# Patient Record
Sex: Female | Born: 1975 | Race: White | Hispanic: No | Marital: Single | Smoking: Former smoker
Health system: Southern US, Community
[De-identification: ages and names within clinical notes are randomized; demographics above are authoritative.]

## PROBLEM LIST (undated history)

## (undated) DIAGNOSIS — K219 Gastro-esophageal reflux disease without esophagitis: Secondary | ICD-10-CM

## (undated) DIAGNOSIS — E785 Hyperlipidemia, unspecified: Secondary | ICD-10-CM

## (undated) DIAGNOSIS — E119 Type 2 diabetes mellitus without complications: Secondary | ICD-10-CM

## (undated) HISTORY — DX: Gastro-esophageal reflux disease without esophagitis: K21.9

## (undated) HISTORY — DX: Type 2 diabetes mellitus without complications: E11.9

## (undated) HISTORY — PX: APPENDECTOMY: SHX54

---

## 2004-12-12 HISTORY — PX: ANTERIOR CRUCIATE LIGAMENT REPAIR: SHX115

## 2011-01-06 ENCOUNTER — Emergency Department (HOSPITAL_COMMUNITY): Payer: BC Managed Care – PPO

## 2011-01-06 ENCOUNTER — Emergency Department (HOSPITAL_COMMUNITY)
Admission: EM | Admit: 2011-01-06 | Discharge: 2011-01-07 | Disposition: A | Discharge status: Home / Self Care | Payer: BC Managed Care – PPO | Attending: Emergency Medicine | Admitting: Emergency Medicine

## 2011-01-06 DIAGNOSIS — R071 Chest pain on breathing: Secondary | ICD-10-CM | POA: Insufficient documentation

## 2011-01-06 DIAGNOSIS — R911 Solitary pulmonary nodule: Secondary | ICD-10-CM | POA: Insufficient documentation

## 2011-01-06 LAB — CBC
Hemoglobin: 13.3 g/dL (ref 12.0–15.0)
MCH: 28.1 pg (ref 26.0–34.0)
MCHC: 33.5 g/dL (ref 30.0–36.0)

## 2011-01-06 LAB — POCT I-STAT, CHEM 8
Calcium, Ion: 1.16 mmol/L (ref 1.12–1.32)
Creatinine, Ser: 0.9 mg/dL (ref 0.50–1.10)
Hemoglobin: 13.6 g/dL (ref 12.0–15.0)
Sodium: 141 mEq/L (ref 135–145)
TCO2: 23 mmol/L (ref 0–100)

## 2011-01-06 LAB — DIFFERENTIAL
Basophils Absolute: 0.1 10*3/uL (ref 0.0–0.1)
Basophils Relative: 1 % (ref 0–1)
Eosinophils Absolute: 0.3 10*3/uL (ref 0.0–0.7)
Monocytes Absolute: 0.6 10*3/uL (ref 0.1–1.0)
Monocytes Relative: 7 % (ref 3–12)
Neutro Abs: 4.9 10*3/uL (ref 1.7–7.7)
Neutrophils Relative %: 58 % (ref 43–77)

## 2011-01-06 MED ORDER — IOHEXOL 300 MG/ML  SOLN
100.0000 mL | Freq: Once | INTRAMUSCULAR | Status: AC | PRN
  Administered 2011-01-06: 100 mL via INTRAVENOUS

## 2012-07-16 ENCOUNTER — Other Ambulatory Visit: Payer: Self-pay | Admitting: Gastroenterology

## 2012-07-16 DIAGNOSIS — R1013 Epigastric pain: Secondary | ICD-10-CM

## 2012-07-31 ENCOUNTER — Ambulatory Visit (HOSPITAL_COMMUNITY): Payer: BC Managed Care – PPO

## 2012-07-31 ENCOUNTER — Ambulatory Visit (HOSPITAL_COMMUNITY)
Admission: RE | Admit: 2012-07-31 | Discharge: 2012-07-31 | Disposition: A | Discharge status: Home / Self Care | Payer: BC Managed Care – PPO | Source: Ambulatory Visit | Attending: Gastroenterology | Admitting: Gastroenterology

## 2012-07-31 DIAGNOSIS — K8020 Calculus of gallbladder without cholecystitis without obstruction: Secondary | ICD-10-CM | POA: Insufficient documentation

## 2012-07-31 DIAGNOSIS — K838 Other specified diseases of biliary tract: Secondary | ICD-10-CM | POA: Insufficient documentation

## 2012-07-31 DIAGNOSIS — R1013 Epigastric pain: Secondary | ICD-10-CM

## 2012-08-06 ENCOUNTER — Encounter (INDEPENDENT_AMBULATORY_CARE_PROVIDER_SITE_OTHER): Payer: Self-pay | Admitting: General Surgery

## 2012-08-06 ENCOUNTER — Ambulatory Visit (INDEPENDENT_AMBULATORY_CARE_PROVIDER_SITE_OTHER): Payer: BC Managed Care – PPO | Admitting: General Surgery

## 2012-08-06 VITALS — BP 126/81 | HR 64 | Temp 98.0°F | Resp 14 | Ht 64.0 in | Wt 235.6 lb

## 2012-08-06 DIAGNOSIS — K8020 Calculus of gallbladder without cholecystitis without obstruction: Secondary | ICD-10-CM

## 2012-08-06 NOTE — Progress Notes (Signed)
Patient ID: Latoya Hayden, female   DOB: 10/12/1975, 37 y.o.   MRN: [id number]  Chief Complaint  Patient presents with  . Abdominal Pain    gallbladder    HPI Latoya Hayden is a 37 y.o. female.  This patient was referred by Dr. Mann for evaluation of symptomatic cholelithiasis. She was initially seen by her gastroenterologist for evaluation of upper abdominal and epigastric abdominal pain which she describes as a band of "tightening" across her upper abdomen which occurs after eating green vegetables and  Dairy products. She notices association with greasy foods. She says that these episodes lasted about 30 minutes to a couple hours and her most recent occurrence was last night. She does have some associated mild nausea without vomiting but denies any fevers or chills and denies any blood in the stool or melena. She has lost about 15 pounds since this occurred due to fear of eating. She did have an ultrasound which demonstrated a 2.6 cm gallstone in the fundus of the gallbladder without other abnormalities. HPI  Past Medical History  Diagnosis Date  . Diabetes mellitus without complication   . GERD (gastroesophageal reflux disease)     Past Surgical History  Procedure Laterality Date  . Appendectomy    . Anterior cruciate ligament repair Right 12/12/2004    History reviewed. No pertinent family history.  Social History History  Substance Use Topics  . Smoking status: Former Smoker    Quit date: 08/07/2003  . Smokeless tobacco: Not on file  . Alcohol Use: Yes    No Known Allergies  Current Outpatient Prescriptions  Medication Sig Dispense Refill  . fenofibrate 54 MG tablet       . HYDROcodone-homatropine (HYCODAN) 5-1.5 MG/5ML syrup       . metFORMIN (GLUCOPHAGE) 500 MG tablet       . TRI-PREVIFEM 0.18/0.215/0.25 MG-35 MCG tablet        No current facility-administered medications for this visit.    Review of Systems Review of Systems All other review of  systems negative or noncontributory except as stated in the HPI  Blood pressure 126/81, pulse 64, temperature 98 F (36.7 C), temperature source Temporal, resp. rate 14, height 5' 4" (1.626 m), weight 235 lb 9.6 oz (106.867 kg).  Physical Exam Physical Exam Physical Exam  Nursing note and vitals reviewed. Constitutional: She is oriented to person, place, and time. She appears well-developed and well-nourished. No distress.  HENT:  Head: Normocephalic and atraumatic.  Mouth/Throat: No oropharyngeal exudate.  Eyes: Conjunctivae and EOM are normal. Pupils are equal, round, and reactive to light. Right eye exhibits no discharge. Left eye exhibits no discharge. No scleral icterus.  Neck: Normal range of motion. Neck supple. No tracheal deviation present.  Cardiovascular: Normal rate, regular rhythm, normal heart sounds and intact distal pulses.   Pulmonary/Chest: Effort normal and breath sounds normal. No stridor. No respiratory distress. She has no wheezes.  Abdominal: Soft. Bowel sounds are normal. She exhibits no distension and no mass. There is no tenderness. There is no rebound and no guarding.  Musculoskeletal: Normal range of motion. She exhibits no edema and no tenderness.  Neurological: She is alert and oriented to person, place, and time.  Skin: Skin is warm and dry. No rash noted. She is not diaphoretic. No erythema. No pallor.  Psychiatric: She has a normal mood and affect. Her behavior is normal. Judgment and thought content normal.    Data Reviewed [place]  Assessment    Symptomatic cholelithiasis   I agree with this is most likely symptomatic cholelithiasis. I discussed with her the options for washer waiting versus cholecystectomy and she would like to proceed with cholecystectomy. We discussed the risks and benefits of the procedure.  The risks of infection, bleeding, pain, persistent symptoms, scarring, injury to bowel or bile ducts, retained stone, diarrhea, need for additional  procedures, and need for open surgery discussed with the patient. She would like to proceed with cholecystectomy      Plan    We will go ahead and set her up for cholecystectomy as soon as convenient        Swayzie Choate DAVID 08/06/2012, 10:05 AM    

## 2012-08-10 ENCOUNTER — Encounter (HOSPITAL_COMMUNITY): Payer: Self-pay | Admitting: Pharmacy Technician

## 2012-08-10 ENCOUNTER — Encounter (HOSPITAL_COMMUNITY): Payer: Self-pay | Admitting: General Surgery

## 2012-08-10 MED ORDER — DEXTROSE 5 % IV SOLN
2.0000 g | INTRAVENOUS | Status: AC
  Administered 2012-08-11: 2 g via INTRAVENOUS

## 2012-08-11 ENCOUNTER — Ambulatory Visit (HOSPITAL_COMMUNITY): Payer: BC Managed Care – PPO

## 2012-08-11 ENCOUNTER — Encounter (HOSPITAL_COMMUNITY): Payer: Self-pay | Admitting: *Deleted

## 2012-08-11 ENCOUNTER — Ambulatory Visit (HOSPITAL_COMMUNITY): Payer: BC Managed Care – PPO | Admitting: Certified Registered"

## 2012-08-11 ENCOUNTER — Encounter (HOSPITAL_COMMUNITY): Payer: Self-pay | Admitting: Certified Registered"

## 2012-08-11 ENCOUNTER — Ambulatory Visit (HOSPITAL_COMMUNITY)
Admission: RE | Admit: 2012-08-11 | Discharge: 2012-08-11 | Disposition: A | Discharge status: Home / Self Care | Payer: BC Managed Care – PPO | Source: Ambulatory Visit | Attending: General Surgery | Admitting: General Surgery

## 2012-08-11 ENCOUNTER — Encounter (HOSPITAL_COMMUNITY)
Admission: RE | Disposition: A | Discharge status: Home / Self Care | Payer: Self-pay | Source: Ambulatory Visit | Attending: General Surgery

## 2012-08-11 DIAGNOSIS — K8010 Calculus of gallbladder with chronic cholecystitis without obstruction: Secondary | ICD-10-CM | POA: Insufficient documentation

## 2012-08-11 DIAGNOSIS — K824 Cholesterolosis of gallbladder: Secondary | ICD-10-CM

## 2012-08-11 DIAGNOSIS — K219 Gastro-esophageal reflux disease without esophagitis: Secondary | ICD-10-CM | POA: Insufficient documentation

## 2012-08-11 DIAGNOSIS — Z87891 Personal history of nicotine dependence: Secondary | ICD-10-CM | POA: Insufficient documentation

## 2012-08-11 DIAGNOSIS — Z79899 Other long term (current) drug therapy: Secondary | ICD-10-CM | POA: Insufficient documentation

## 2012-08-11 DIAGNOSIS — E119 Type 2 diabetes mellitus without complications: Secondary | ICD-10-CM | POA: Insufficient documentation

## 2012-08-11 DIAGNOSIS — K8020 Calculus of gallbladder without cholecystitis without obstruction: Secondary | ICD-10-CM

## 2012-08-11 HISTORY — PX: CHOLECYSTECTOMY: SHX55

## 2012-08-11 HISTORY — DX: Hyperlipidemia, unspecified: E78.5

## 2012-08-11 LAB — COMPREHENSIVE METABOLIC PANEL
ALT: 20 U/L (ref 0–35)
Albumin: 3.6 g/dL (ref 3.5–5.2)
Alkaline Phosphatase: 42 U/L (ref 39–117)
Potassium: 4.3 mEq/L (ref 3.5–5.1)
Sodium: 139 mEq/L (ref 135–145)
Total Protein: 7.1 g/dL (ref 6.0–8.3)

## 2012-08-11 LAB — CBC WITH DIFFERENTIAL/PLATELET
Basophils Relative: 1 % (ref 0–1)
Eosinophils Absolute: 0.7 10*3/uL (ref 0.0–0.7)
Lymphs Abs: 2 10*3/uL (ref 0.7–4.0)
MCH: 27.5 pg (ref 26.0–34.0)
MCHC: 34.6 g/dL (ref 30.0–36.0)
Neutrophils Relative %: 56 % (ref 43–77)
Platelets: 285 10*3/uL (ref 150–400)
RBC: 4.76 MIL/uL (ref 3.87–5.11)

## 2012-08-11 LAB — GLUCOSE, CAPILLARY: Glucose-Capillary: 104 mg/dL — ABNORMAL HIGH (ref 70–99)

## 2012-08-11 LAB — SURGICAL PCR SCREEN: Staphylococcus aureus: NEGATIVE

## 2012-08-11 SURGERY — LAPAROSCOPIC CHOLECYSTECTOMY WITH INTRAOPERATIVE CHOLANGIOGRAM
Anesthesia: General | Site: Abdomen | Wound class: Clean Contaminated

## 2012-08-11 MED ORDER — ACETAMINOPHEN 10 MG/ML IV SOLN
INTRAVENOUS | Status: AC
  Administered 2012-08-11: 1000 mg via INTRAVENOUS
  Filled 2012-08-11: qty 100

## 2012-08-11 MED ORDER — HYDROMORPHONE HCL PF 1 MG/ML IJ SOLN
INTRAMUSCULAR | Status: AC
  Filled 2012-08-11: qty 1

## 2012-08-11 MED ORDER — NEOSTIGMINE METHYLSULFATE 1 MG/ML IJ SOLN
INTRAMUSCULAR | Status: DC | PRN
  Administered 2012-08-11: 3 mg via INTRAVENOUS

## 2012-08-11 MED ORDER — FENTANYL CITRATE 0.05 MG/ML IJ SOLN
INTRAMUSCULAR | Status: DC | PRN
  Administered 2012-08-11 (×4): 50 ug via INTRAVENOUS
  Administered 2012-08-11: 200 ug via INTRAVENOUS
  Administered 2012-08-11 (×2): 50 ug via INTRAVENOUS

## 2012-08-11 MED ORDER — ROCURONIUM BROMIDE 100 MG/10ML IV SOLN
INTRAVENOUS | Status: DC | PRN
  Administered 2012-08-11: 50 mg via INTRAVENOUS

## 2012-08-11 MED ORDER — MUPIROCIN 2 % EX OINT: TOPICAL_OINTMENT | Freq: Two times a day (BID) | CUTANEOUS | Status: DC

## 2012-08-11 MED ORDER — LACTATED RINGERS IV SOLN
INTRAVENOUS | Status: DC | PRN
  Administered 2012-08-11 (×2): via INTRAVENOUS

## 2012-08-11 MED ORDER — PROMETHAZINE HCL 25 MG/ML IJ SOLN
INTRAMUSCULAR | Status: AC
  Filled 2012-08-11: qty 1

## 2012-08-11 MED ORDER — LIDOCAINE-EPINEPHRINE (PF) 1 %-1:200000 IJ SOLN
INTRAMUSCULAR | Status: DC | PRN
  Administered 2012-08-11: 09:00:00 via INTRAMUSCULAR

## 2012-08-11 MED ORDER — OXYCODONE HCL 5 MG/5ML PO SOLN: 5.0000 mg | Freq: Once | ORAL | Status: DC | PRN

## 2012-08-11 MED ORDER — LIDOCAINE HCL (CARDIAC) 20 MG/ML IV SOLN
INTRAVENOUS | Status: DC | PRN
  Administered 2012-08-11: 30 mg via INTRAVENOUS

## 2012-08-11 MED ORDER — 0.9 % SODIUM CHLORIDE (POUR BTL) OPTIME
TOPICAL | Status: DC | PRN
  Administered 2012-08-11: 1000 mL

## 2012-08-11 MED ORDER — OXYCODONE HCL 5 MG PO TABS: 5.0000 mg | ORAL_TABLET | ORAL | Status: DC | PRN

## 2012-08-11 MED ORDER — GLYCOPYRROLATE 0.2 MG/ML IJ SOLN
INTRAMUSCULAR | Status: DC | PRN
  Administered 2012-08-11: 0.2 mg via INTRAVENOUS
  Administered 2012-08-11: 0.4 mg via INTRAVENOUS

## 2012-08-11 MED ORDER — PROMETHAZINE HCL 25 MG/ML IJ SOLN
6.2500 mg | INTRAMUSCULAR | Status: DC | PRN
  Administered 2012-08-11: 6.25 mg via INTRAVENOUS

## 2012-08-11 MED ORDER — MIDAZOLAM HCL 5 MG/5ML IJ SOLN
INTRAMUSCULAR | Status: DC | PRN
  Administered 2012-08-11: 2 mg via INTRAVENOUS

## 2012-08-11 MED ORDER — CEFAZOLIN SODIUM-DEXTROSE 2-3 GM-% IV SOLR
INTRAVENOUS | Status: AC
  Filled 2012-08-11: qty 50

## 2012-08-11 MED ORDER — MUPIROCIN 2 % EX OINT
TOPICAL_OINTMENT | CUTANEOUS | Status: AC
  Administered 2012-08-11: 1 via NASAL
  Filled 2012-08-11: qty 22

## 2012-08-11 MED ORDER — ONDANSETRON HCL 4 MG/2ML IJ SOLN
INTRAMUSCULAR | Status: AC
  Filled 2012-08-11: qty 2

## 2012-08-11 MED ORDER — ONDANSETRON HCL 4 MG/2ML IJ SOLN
INTRAMUSCULAR | Status: DC | PRN
  Administered 2012-08-11: 4 mg via INTRAVENOUS

## 2012-08-11 MED ORDER — HYDROMORPHONE HCL PF 1 MG/ML IJ SOLN
0.2500 mg | INTRAMUSCULAR | Status: DC | PRN
  Administered 2012-08-11 (×2): 0.25 mg via INTRAVENOUS

## 2012-08-11 MED ORDER — MIDAZOLAM HCL 2 MG/2ML IJ SOLN: 0.5000 mg | Freq: Once | INTRAMUSCULAR | Status: DC | PRN

## 2012-08-11 MED ORDER — SODIUM CHLORIDE 0.9 % IR SOLN
Status: DC | PRN
  Administered 2012-08-11: 1000 mL

## 2012-08-11 MED ORDER — MEPERIDINE HCL 25 MG/ML IJ SOLN: 6.2500 mg | INTRAMUSCULAR | Status: DC | PRN

## 2012-08-11 MED ORDER — OXYCODONE HCL 5 MG PO TABS: 5.0000 mg | ORAL_TABLET | Freq: Once | ORAL | Status: DC | PRN

## 2012-08-11 MED ORDER — HYDROCODONE-ACETAMINOPHEN 5-325 MG PO TABS: 1.0000 | ORAL_TABLET | ORAL | Status: DC | PRN

## 2012-08-11 MED ORDER — PROPOFOL 10 MG/ML IV BOLUS
INTRAVENOUS | Status: DC | PRN
  Administered 2012-08-11: 200 mg via INTRAVENOUS

## 2012-08-11 MED ORDER — SODIUM CHLORIDE 0.9 % IV SOLN
INTRAVENOUS | Status: DC | PRN
  Administered 2012-08-11: 09:00:00

## 2012-08-11 SURGICAL SUPPLY — 44 items
APPLIER CLIP ROT 10 11.4 M/L (STAPLE) ×2
BLADE SURG ROTATE 9660 (MISCELLANEOUS) IMPLANT
CANISTER SUCTION 2500CC (MISCELLANEOUS) ×2 IMPLANT
CHLORAPREP W/TINT 26ML (MISCELLANEOUS) ×2 IMPLANT
CLIP APPLIE ROT 10 11.4 M/L (STAPLE) ×1 IMPLANT
CLOTH BEACON ORANGE TIMEOUT ST (SAFETY) ×2 IMPLANT
COVER SURGICAL LIGHT HANDLE (MISCELLANEOUS) ×2 IMPLANT
DECANTER SPIKE VIAL GLASS SM (MISCELLANEOUS) IMPLANT
DERMABOND ADVANCED (GAUZE/BANDAGES/DRESSINGS) ×1
DERMABOND ADVANCED .7 DNX12 (GAUZE/BANDAGES/DRESSINGS) ×1 IMPLANT
DRAPE C-ARM 42X72 X-RAY (DRAPES) ×2 IMPLANT
ELECT CAUTERY BLADE 6.4 (BLADE) ×2 IMPLANT
ELECT REM PT RETURN 9FT ADLT (ELECTROSURGICAL) ×2
ELECTRODE REM PT RTRN 9FT ADLT (ELECTROSURGICAL) ×1 IMPLANT
GLOVE BIO SURGEON STRL SZ7 (GLOVE) ×2 IMPLANT
GLOVE BIO SURGEON STRL SZ7.5 (GLOVE) ×2 IMPLANT
GLOVE BIOGEL PI IND STRL 7.0 (GLOVE) ×3 IMPLANT
GLOVE BIOGEL PI IND STRL 7.5 (GLOVE) ×1 IMPLANT
GLOVE BIOGEL PI INDICATOR 7.0 (GLOVE) ×3
GLOVE BIOGEL PI INDICATOR 7.5 (GLOVE) ×1
GLOVE SURG SS PI 7.0 STRL IVOR (GLOVE) ×2 IMPLANT
GLOVE SURG SS PI 7.5 STRL IVOR (GLOVE) ×4 IMPLANT
GOWN STRL NON-REIN LRG LVL3 (GOWN DISPOSABLE) ×6 IMPLANT
GOWN STRL REIN XL XLG (GOWN DISPOSABLE) ×2 IMPLANT
KIT BASIN OR (CUSTOM PROCEDURE TRAY) ×2 IMPLANT
KIT ROOM TURNOVER OR (KITS) ×2 IMPLANT
NS IRRIG 1000ML POUR BTL (IV SOLUTION) ×2 IMPLANT
PAD ARMBOARD 7.5X6 YLW CONV (MISCELLANEOUS) ×2 IMPLANT
PENCIL BUTTON HOLSTER BLD 10FT (ELECTRODE) ×2 IMPLANT
POUCH SPECIMEN RETRIEVAL 10MM (ENDOMECHANICALS) ×2 IMPLANT
SCISSORS LAP 5X35 DISP (ENDOMECHANICALS) IMPLANT
SET CHOLANGIOGRAPH 5 50 .035 (SET/KITS/TRAYS/PACK) ×2 IMPLANT
SET IRRIG TUBING LAPAROSCOPIC (IRRIGATION / IRRIGATOR) ×2 IMPLANT
SLEEVE ENDOPATH XCEL 5M (ENDOMECHANICALS) ×2 IMPLANT
SPECIMEN JAR SMALL (MISCELLANEOUS) ×2 IMPLANT
SUT MNCRL AB 4-0 PS2 18 (SUTURE) ×4 IMPLANT
SUT VICRYL 0 UR6 27IN ABS (SUTURE) ×2 IMPLANT
TOWEL OR 17X24 6PK STRL BLUE (TOWEL DISPOSABLE) ×2 IMPLANT
TOWEL OR 17X26 10 PK STRL BLUE (TOWEL DISPOSABLE) ×2 IMPLANT
TRAY FOLEY CATH 14FR (SET/KITS/TRAYS/PACK) IMPLANT
TRAY LAPAROSCOPIC (CUSTOM PROCEDURE TRAY) ×2 IMPLANT
TROCAR BALLN 12MMX100 BLUNT (TROCAR) ×2 IMPLANT
TROCAR XCEL NON-BLD 11X100MML (ENDOMECHANICALS) ×2 IMPLANT
TROCAR XCEL NON-BLD 5MMX100MML (ENDOMECHANICALS) ×2 IMPLANT

## 2012-08-11 NOTE — Anesthesia Preprocedure Evaluation (Signed)
Anesthesia Evaluation  Patient identified by MRN, date of birth, ID band Patient awake    Reviewed: Allergy & Precautions, H&P , NPO status , Patient's Chart, lab work & pertinent test results  History of Anesthesia Complications Negative for: history of anesthetic complications  Airway Mallampati: II TM Distance: >3 FB Neck ROM: Full    Dental  (+) Teeth Intact and Dental Advisory Given   Pulmonary neg pulmonary ROS,  breath sounds clear to auscultation  Pulmonary exam normal       Cardiovascular negative cardio ROS  Rhythm:Regular     Neuro/Psych negative neurological ROS  negative psych ROS   GI/Hepatic Neg liver ROS, GERD-  Medicated and Controlled,  Endo/Other  diabetes (glu 104), Well Controlled, Type 2, Oral Hypoglycemic AgentsMorbid obesity  Renal/GU negative Renal ROS     Musculoskeletal   Abdominal (+) + obese,   Peds  Hematology   Anesthesia Other Findings   Reproductive/Obstetrics LMP this week                           Anesthesia Physical Anesthesia Plan  ASA: II  Anesthesia Plan: General   Post-op Pain Management:    Induction: Intravenous  Airway Management Planned: Oral ETT  Additional Equipment:   Intra-op Plan:   Post-operative Plan: Extubation in OR  Informed Consent: I have reviewed the patients History and Physical, chart, labs and discussed the procedure including the risks, benefits and alternatives for the proposed anesthesia with the patient or authorized representative who has indicated his/her understanding and acceptance.   Dental advisory given  Plan Discussed with: Surgeon and CRNA  Anesthesia Plan Comments: (Plan routine monitors, GETA)        Anesthesia Quick Evaluation

## 2012-08-11 NOTE — H&P (View-Only) (Signed)
Patient ID: Latoya Hayden, female   DOB: 1976-01-28, 37 y.o.   MRN: [id number]  Chief Complaint  Patient presents with  . Abdominal Pain    gallbladder    HPI Latoya Hayden is a 37 y.o. female.  This patient was referred by Dr. Loreta Ave for evaluation of symptomatic cholelithiasis. She was initially seen by her gastroenterologist for evaluation of upper abdominal and epigastric abdominal pain which she describes as a band of "tightening" across her upper abdomen which occurs after eating green vegetables and  Dairy products. She notices association with greasy foods. She says that these episodes lasted about 30 minutes to a couple hours and her most recent occurrence was last night. She does have some associated mild nausea without vomiting but denies any fevers or chills and denies any blood in the stool or melena. She has lost about 15 pounds since this occurred due to fear of eating. She did have an ultrasound which demonstrated a 2.6 cm gallstone in the fundus of the gallbladder without other abnormalities. HPI  Past Medical History  Diagnosis Date  . Diabetes mellitus without complication   . GERD (gastroesophageal reflux disease)     Past Surgical History  Procedure Laterality Date  . Appendectomy    . Anterior cruciate ligament repair Right 12/12/2004    History reviewed. No pertinent family history.  Social History History  Substance Use Topics  . Smoking status: Former Smoker    Quit date: 08/07/2003  . Smokeless tobacco: Not on file  . Alcohol Use: Yes    No Known Allergies  Current Outpatient Prescriptions  Medication Sig Dispense Refill  . fenofibrate 54 MG tablet       . HYDROcodone-homatropine (HYCODAN) 5-1.5 MG/5ML syrup       . metFORMIN (GLUCOPHAGE) 500 MG tablet       . TRI-PREVIFEM 0.18/0.215/0.25 MG-35 MCG tablet        No current facility-administered medications for this visit.    Review of Systems Review of Systems All other review of  systems negative or noncontributory except as stated in the HPI  Blood pressure 126/81, pulse 64, temperature 98 F (36.7 C), temperature source Temporal, resp. rate 14, height 5\' 4"  (1.626 m), weight 235 lb 9.6 oz (106.867 kg).  Physical Exam Physical Exam Physical Exam  Nursing note and vitals reviewed. Constitutional: She is oriented to person, place, and time. She appears well-developed and well-nourished. No distress.  HENT:  Head: Normocephalic and atraumatic.  Mouth/Throat: No oropharyngeal exudate.  Eyes: Conjunctivae and EOM are normal. Pupils are equal, round, and reactive to light. Right eye exhibits no discharge. Left eye exhibits no discharge. No scleral icterus.  Neck: Normal range of motion. Neck supple. No tracheal deviation present.  Cardiovascular: Normal rate, regular rhythm, normal heart sounds and intact distal pulses.   Pulmonary/Chest: Effort normal and breath sounds normal. No stridor. No respiratory distress. She has no wheezes.  Abdominal: Soft. Bowel sounds are normal. She exhibits no distension and no mass. There is no tenderness. There is no rebound and no guarding.  Musculoskeletal: Normal range of motion. She exhibits no edema and no tenderness.  Neurological: She is alert and oriented to person, place, and time.  Skin: Skin is warm and dry. No rash noted. She is not diaphoretic. No erythema. No pallor.  Psychiatric: She has a normal mood and affect. Her behavior is normal. Judgment and thought content normal.    Data Reviewed [place]  Assessment    Symptomatic cholelithiasis  I agree with this is most likely symptomatic cholelithiasis. I discussed with her the options for washer waiting versus cholecystectomy and she would like to proceed with cholecystectomy. We discussed the risks and benefits of the procedure.  The risks of infection, bleeding, pain, persistent symptoms, scarring, injury to bowel or bile ducts, retained stone, diarrhea, need for additional  procedures, and need for open surgery discussed with the patient. She would like to proceed with cholecystectomy      Plan    We will go ahead and set her up for cholecystectomy as soon as convenient        Latoya Hayden Latoya 08/06/2012, 10:05 AM

## 2012-08-11 NOTE — Transfer of Care (Signed)
Immediate Anesthesia Transfer of Care Note  Patient: Latoya Hayden  Procedure(s) Performed: Procedure(s): LAPAROSCOPIC CHOLECYSTECTOMY WITH INTRAOPERATIVE CHOLANGIOGRAM (N/A)  Patient Location: PACU  Anesthesia Type:General  Level of Consciousness: awake, alert , oriented and patient cooperative  Airway & Oxygen Therapy: Patient Spontanous Breathing and Patient connected to nasal cannula oxygen  Post-op Assessment: Report given to PACU RN, Post -op Vital signs reviewed and stable and Patient moving all extremities  Post vital signs: Reviewed and stable  Complications: No apparent anesthesia complications

## 2012-08-11 NOTE — Brief Op Note (Signed)
08/11/2012  9:09 AM  PATIENT:  Latoya Hayden  37 y.o. female  PRE-OPERATIVE DIAGNOSIS:  cholelithiasis   POST-OPERATIVE DIAGNOSIS:  cholelithiasis   PROCEDURE:  Procedure(s): LAPAROSCOPIC CHOLECYSTECTOMY WITH INTRAOPERATIVE CHOLANGIOGRAM (N/A)  SURGEON:  Surgeon(s) and Role:    * Lodema Pilot, DO - Primary  PHYSICIAN ASSISTANT:   ASSISTANTS: none   ANESTHESIA:   general  EBL:  Total I/O In: 1300 [I.V.:1300] Out: 50 [Blood:50]  BLOOD ADMINISTERED:none  DRAINS: none   LOCAL MEDICATIONS USED:  MARCAINE    and LIDOCAINE   SPECIMEN:  Source of Specimen:  gallbladder  DISPOSITION OF SPECIMEN:  PATHOLOGY  COUNTS:  YES  TOURNIQUET:  * No tourniquets in log *  DICTATION: .Other Dictation: Dictation Number [id number]  PLAN OF CARE: Discharge to home after PACU  PATIENT DISPOSITION:  PACU - hemodynamically stable.   Delay start of Pharmacological VTE agent (>24hrs) due to surgical blood loss or risk of bleeding: no

## 2012-08-11 NOTE — Anesthesia Procedure Notes (Signed)
Procedure Name: Intubation Date/Time: 08/11/2012 7:41 AM Performed by: Jerilee Hoh Pre-anesthesia Checklist: Patient identified, Emergency Drugs available, Suction available and Patient being monitored Patient Re-evaluated:Patient Re-evaluated prior to inductionOxygen Delivery Method: Circle system utilized Preoxygenation: Pre-oxygenation with 100% oxygen Intubation Type: IV induction Ventilation: Mask ventilation without difficulty Laryngoscope Size: Mac and 3 Grade View: Grade I Tube type: Oral Tube size: 7.0 mm Number of attempts: 1 Airway Equipment and Method: Stylet Placement Confirmation: ETT inserted through vocal cords under direct vision,  positive ETCO2 and breath sounds checked- equal and bilateral Secured at: 21 cm Tube secured with: Tape Dental Injury: Teeth and Oropharynx as per pre-operative assessment  Comments: Smooth IV induction. Easy mask airway by [facility] paramedic.  DL x1 by [facility] paramedic, atraumatic oral intubation.

## 2012-08-11 NOTE — Op Note (Signed)
NAMESMRITHI, Hayden           ACCOUNT NO.:  [id number]  MEDICAL RECORD NO.:  [id number]  LOCATION:  [facility]                         FACILITY:  [facility]  PHYSICIAN:  Lodema Pilot, MD       DATE OF BIRTH:  March 26, 1976  DATE OF PROCEDURE:  08/11/2012 DATE OF DISCHARGE:  08/11/2012                              OPERATIVE REPORT   PROCEDURE:  Laparoscopic cholecystectomy with intraoperative cholangiogram.  PREOPERATIVE DIAGNOSIS:  Symptomatic cholelithiasis.  POSTOPERATIVE DIAGNOSIS:  Symptomatic cholelithiasis.  SURGEON:  Lodema Pilot, MD  ASSISTANT:  None.  ANESTHESIA:  General endotracheal tube anesthesia with 28 mL of 1% lidocaine with epinephrine and 0.25% Marcaine in a 50:50 mixture.  FLUIDS:  1300 mL of crystalloid.  ESTIMATED BLOOD LOSS:  Minimal.  DRAINS:  None.  SPECIMEN:  Gallbladder and contents sent to Pathology for permanent sectioning.  COMPLICATIONS:  None apparent.  FINDINGS:  Normal-appearing gallbladder anatomy.  Initial cholangiogram look like there was possibly some small gallstones or air bubbles in the common bile duct.  After flushing the duct and repeating the cholangiogram, the cholangiogram appeared normal.  INDICATION FOR PROCEDURE:  Latoya Hayden is a 37 year old female with few episodes of symptomatic abdominal pain consistent with symptomatic cholelithiasis.  She had an ultrasound demonstrating large gallstone and she desired cholecystectomy for definitive treatment.  OPERATIVE DETAILS:  [name] was seen and evaluated in the preoperative area and risks and benefits of the procedure were again discussed in lay terms.  Informed consent was obtained.  She was taken to the operating room, placed on the table in supine position and general endotracheal tube anesthesia was obtained.  Prophylactic antibiotics were given.  Her abdomen was prepped and draped in a standard surgical fashion.  Procedure time-out was performed with all operative  team members and a supraumbilical midline incision was made in the skin and the area of a scar, which was already present, and dissection carried down to the subcutaneous tissue using blunt dissection.  The abdominal wall fascia was elevated and sharply incised and peritoneum entered bluntly with my finger.  A 12-mm balloon port was placed at the umbilicus and pneumoperitoneum was obtained.  Laparoscope was introduced and there was no evidence of bleeding or bowel injury upon entry.  Two 5-mm right upper quadrant ports were placed under direct visualization and 11-mm epigastric port was placed.  The gallbladder was identified and did not appear to have any inflammatory changes.  No other obvious intra-abdominal pathology was identified. The gallbladder was retracted cephalad and the peritoneum was taken down from the gallbladder using blunt dissection.  The cystic duct and cystic artery were easily identified in the usual anatomic position.  She had a small artery just medial to the cystic duct and this was skeletonized and clips were placed, but it was not divided at this time.  The cystic duct was skeletonized and critical view of safety was obtained visualizing a single cystic duct entering the gallbladder and liver parenchyma had visualized through the triangle of Calot and the single cystic artery coursing through the triangle of Calot.  A clip was placed on the gallbladder and cystic duct junction and small ductotomy was made and the cholangiogram was performed.  Initially, the cholangiogram appeared to have some filling defects in the hepatic duct with what look like could be a small mobile gallstones or air bubbles.  I flushed the duct with 20 mL of saline and repeated the cholangiogram, and at this time, I did not see any other filling defects, and I feel that, that was most likely due to some small air bubbles.  The cholangiogram catheter was removed and the duct was clipped with  hemoclips and the duct was divided and the previously clipped artery was also divided.  Then, the gallbladder was removed from the gallbladder fossa using Bovie electrocautery.  As almost completely mobilized the gallbladder from the gallbladder fossa, note that the hook cautery appeared to have some insulation from near the tip and this had been working on to the liver as well, but I inspected the duodenum very thoroughly in the distal stomach several times and there was no evidence of any cautery injury. There was no blanching or tiring or again any evidence of any artery to the intestines.  The gallbladder was completely removed from the gallbladder fossa and placed in an EndoCatch bag.  She had a very long gallbladder, and this was removed in an EndoCatch bag through the umbilical trocar site.  I had to slightly enlarge the fascial incision to accommodate the removal of a large gallstone and this was passed off the table and sent to Pathology for permanent sectioning.  I placed the camera back in the abdomen and irrigated the right upper quadrant.  All irrigation returned clear and inspected the gallbladder fossa for hemostasis, which was noted to be adequate.  I again inspected the duodenum and distal stomach for any evidence of bowel injury and none was identified.  There was no bile or bleeding and the right upper quadrant trocars were removed under direct visualization and the umbilical fascia was approximated with 0 Vicryl sutures in open fashion. The abdomen was re-insufflated with carbon dioxide gas through the epigastric trocar and the abdominal wall closure was noted to be adequate without any evidence of bleeding or bowel injury, and again, the right upper quadrant, duodenum and stomach were inspected and there was no evidence of any injury.  The final trocars were removed and the wounds were injected with total of 28 mL of 1% lidocaine with epinephrine and 0.25% Marcaine in  a 50:50 mixture.  The skin edges were approximated with 4-0 Monocryl subcuticular suture.  Skin was washed and dried, and Dermabond was applied.  All sponge, needle, and instrument counts were correct at the end of the case.  The patient tolerated the procedure well without apparent complications.          ______________________________ Lodema Pilot, MD     BL/MEDQ  D:  08/11/2012  T:  08/11/2012  Job:  [id number]

## 2012-08-11 NOTE — Anesthesia Postprocedure Evaluation (Signed)
  Anesthesia Post-op Note  Patient: Latoya Hayden  Procedure(s) Performed: Procedure(s): LAPAROSCOPIC CHOLECYSTECTOMY WITH INTRAOPERATIVE CHOLANGIOGRAM (N/A)  Patient Location: PACU  Anesthesia Type:General  Level of Consciousness: awake, alert , oriented and patient cooperative  Airway and Oxygen Therapy: Patient Spontanous Breathing  Post-op Pain: mild  Post-op Assessment: Post-op Vital signs reviewed, Patient's Cardiovascular Status Stable, Respiratory Function Stable, Patent Airway, No signs of Nausea or vomiting and Pain level controlled  Post-op Vital Signs: Reviewed and stable  Complications: No apparent anesthesia complications

## 2012-08-11 NOTE — Interval H&P Note (Signed)
History and Physical Interval Note:  08/11/2012 7:14 AM  [name]  has presented today for surgery, with the diagnosis of cholelithiasis   The various methods of treatment have been discussed with the patient and family. After consideration of risks, benefits and other options for treatment, the patient has consented to  Procedure(s): LAPAROSCOPIC CHOLECYSTECTOMY WITH INTRAOPERATIVE CHOLANGIOGRAM (N/A) as a surgical intervention .  The patient's history has been reviewed, patient examined, no change in status, stable for surgery.  I have reviewed the patient's chart and labs.  Questions were answered to the patient's satisfaction.  She was seen and evaluated in the preop area.  Risks of procedure again discussed in lay terms.  The risks of infection, bleeding, pain, persistent symptoms, scarring, injury to bowel or bile ducts, retained stone, diarrhea, need for additional procedures, and need for open surgery discussed with the patient.  She desires to proceed with lap chole, IOC, possible open surgery.    Lodema Pilot DAVID

## 2012-08-12 ENCOUNTER — Encounter (HOSPITAL_COMMUNITY): Payer: Self-pay | Admitting: General Surgery

## 2012-08-14 ENCOUNTER — Ambulatory Visit (INDEPENDENT_AMBULATORY_CARE_PROVIDER_SITE_OTHER): Payer: Self-pay | Admitting: Surgery

## 2012-08-28 ENCOUNTER — Encounter (INDEPENDENT_AMBULATORY_CARE_PROVIDER_SITE_OTHER): Payer: Self-pay | Admitting: General Surgery

## 2012-08-28 ENCOUNTER — Ambulatory Visit (INDEPENDENT_AMBULATORY_CARE_PROVIDER_SITE_OTHER): Payer: BC Managed Care – PPO | Admitting: General Surgery

## 2012-08-28 VITALS — BP 126/82 | HR 70 | Temp 97.3°F | Resp 16 | Ht 64.0 in | Wt 235.6 lb

## 2012-08-28 DIAGNOSIS — Z4889 Encounter for other specified surgical aftercare: Secondary | ICD-10-CM

## 2012-08-28 DIAGNOSIS — Z5189 Encounter for other specified aftercare: Secondary | ICD-10-CM

## 2012-08-28 NOTE — Progress Notes (Signed)
Subjective:     Patient ID: Latoya Hayden, female   DOB: 1975-06-15, 37 y.o.   MRN: [id number]  HPI This patient follows up status post laparoscopic cholecystectomy for medical lithiasis. She says that she feels much better than she did preoperatively and has not had any issues with eating. She had some constipation initially but this resolved with over-the-counter stool softeners. She is off pain medication and has not had any other complaints. Pathology was benign  Review of Systems     Objective:   Physical Exam She looks well and is in no acute distress Her abdomen is soft figure of exam her incisions are healing nicely    Assessment:     Status post arthroscopic cholecystectomy-doing well She's recovering well from her procedure and has not had any recurrent episodes of postprandial pain. Her pathology is benign. She is very satisfied with her outcome     Plan:     She can gradually increase activity as tolerated and follow up with me when necessary

## 2012-12-30 ENCOUNTER — Other Ambulatory Visit: Payer: Self-pay | Admitting: Obstetrics & Gynecology

## 2012-12-30 DIAGNOSIS — N6310 Unspecified lump in the right breast, unspecified quadrant: Secondary | ICD-10-CM

## 2013-01-11 ENCOUNTER — Ambulatory Visit
Admission: RE | Admit: 2013-01-11 | Discharge: 2013-01-11 | Disposition: A | Discharge status: Home / Self Care | Payer: BC Managed Care – PPO | Source: Ambulatory Visit | Attending: Obstetrics & Gynecology | Admitting: Obstetrics & Gynecology

## 2013-01-11 DIAGNOSIS — N6310 Unspecified lump in the right breast, unspecified quadrant: Secondary | ICD-10-CM

## 2014-07-25 ENCOUNTER — Other Ambulatory Visit: Payer: Self-pay | Admitting: Obstetrics & Gynecology

## 2014-07-26 ENCOUNTER — Other Ambulatory Visit: Payer: Self-pay | Admitting: Obstetrics & Gynecology

## 2014-07-26 DIAGNOSIS — N6315 Unspecified lump in the right breast, overlapping quadrants: Secondary | ICD-10-CM

## 2014-07-26 DIAGNOSIS — N6310 Unspecified lump in the right breast, unspecified quadrant: Principal | ICD-10-CM

## 2014-07-26 LAB — CYTOLOGY - PAP

## 2014-08-01 ENCOUNTER — Other Ambulatory Visit: Payer: BC Managed Care – PPO

## 2014-08-11 ENCOUNTER — Ambulatory Visit
Admission: RE | Admit: 2014-08-11 | Discharge: 2014-08-11 | Disposition: A | Discharge status: Home / Self Care | Payer: BC Managed Care – PPO | Source: Ambulatory Visit | Attending: Obstetrics & Gynecology | Admitting: Obstetrics & Gynecology

## 2014-08-11 DIAGNOSIS — N6315 Unspecified lump in the right breast, overlapping quadrants: Secondary | ICD-10-CM

## 2014-08-11 DIAGNOSIS — N6310 Unspecified lump in the right breast, unspecified quadrant: Principal | ICD-10-CM

## 2014-10-29 ENCOUNTER — Emergency Department (HOSPITAL_COMMUNITY)
Admission: EM | Admit: 2014-10-29 | Discharge: 2014-10-30 | Disposition: A | Discharge status: Home / Self Care | Payer: BC Managed Care – PPO | Attending: Emergency Medicine | Admitting: Emergency Medicine

## 2014-10-29 ENCOUNTER — Encounter (HOSPITAL_COMMUNITY): Payer: Self-pay | Admitting: Emergency Medicine

## 2014-10-29 ENCOUNTER — Emergency Department (HOSPITAL_COMMUNITY): Payer: BC Managed Care – PPO

## 2014-10-29 DIAGNOSIS — E785 Hyperlipidemia, unspecified: Secondary | ICD-10-CM | POA: Diagnosis not present

## 2014-10-29 DIAGNOSIS — E119 Type 2 diabetes mellitus without complications: Secondary | ICD-10-CM | POA: Diagnosis not present

## 2014-10-29 DIAGNOSIS — Z87891 Personal history of nicotine dependence: Secondary | ICD-10-CM | POA: Insufficient documentation

## 2014-10-29 DIAGNOSIS — Z8719 Personal history of other diseases of the digestive system: Secondary | ICD-10-CM | POA: Diagnosis not present

## 2014-10-29 DIAGNOSIS — F419 Anxiety disorder, unspecified: Secondary | ICD-10-CM | POA: Insufficient documentation

## 2014-10-29 DIAGNOSIS — R079 Chest pain, unspecified: Secondary | ICD-10-CM | POA: Diagnosis present

## 2014-10-29 DIAGNOSIS — Z79899 Other long term (current) drug therapy: Secondary | ICD-10-CM | POA: Diagnosis not present

## 2014-10-29 LAB — CBC
HEMATOCRIT: 44.6 % (ref 36.0–46.0)
Hemoglobin: 14.4 g/dL (ref 12.0–15.0)
MCH: 27.7 pg (ref 26.0–34.0)
MCHC: 32.3 g/dL (ref 30.0–36.0)
MCV: 85.9 fL (ref 78.0–100.0)
Platelets: 366 10*3/uL (ref 150–400)
RBC: 5.19 MIL/uL — ABNORMAL HIGH (ref 3.87–5.11)
RDW: 13.6 % (ref 11.5–15.5)
WBC: 8.9 10*3/uL (ref 4.0–10.5)

## 2014-10-29 LAB — BASIC METABOLIC PANEL
Anion gap: 8 (ref 5–15)
BUN: 18 mg/dL (ref 6–20)
CHLORIDE: 104 mmol/L (ref 101–111)
CO2: 25 mmol/L (ref 22–32)
Calcium: 9.2 mg/dL (ref 8.9–10.3)
Creatinine, Ser: 1.18 mg/dL — ABNORMAL HIGH (ref 0.44–1.00)
GFR calc Af Amer: >60 mL/min (ref >60)
GFR, EST NON AFRICAN AMERICAN: 57 mL/min — AB (ref >60)
GLUCOSE: 106 mg/dL — AB (ref 65–99)
POTASSIUM: 4 mmol/L (ref 3.5–5.1)
SODIUM: 137 mmol/L (ref 135–145)

## 2014-10-29 LAB — I-STAT TROPONIN, ED: TROPONIN I, POC: 0 ng/mL (ref 0.00–0.08)

## 2014-10-29 NOTE — Discharge Instructions (Signed)
Panic Attacks °Panic attacks are sudden, short feelings of great fear or discomfort. You may have them for no reason when you are relaxed, when you are uneasy (anxious), or when you are sleeping.  °HOME CARE °· Take all your medicines as told. °· Check with your doctor before starting new medicines. °· Keep all doctor visits. °GET HELP IF: °· You are not able to take your medicines as told. °· Your symptoms do not get better. °· Your symptoms get worse. °GET HELP RIGHT AWAY IF: °· Your attacks seem different than your normal attacks. °· You have thoughts about hurting yourself or others. °· You take panic attack medicine and you have a side effect. °MAKE SURE YOU: °· Understand these instructions. °· Will watch your condition. °· Will get help right away if you are not doing well or get worse. °Document Released: 04/27/2010 Document Revised: 01/13/2013 Document Reviewed: 11/06/2012 °[employer]® Patient Information ©2015 [employer]. This information is not intended to replace advice given to you by your health care provider. Make sure you discuss any questions you have with your health care provider. ° °

## 2014-10-29 NOTE — ED Provider Notes (Signed)
CSN: [id number]     Arrival date & time 10/29/14  2021 History   This chart was scribed for Elpidio Anis, PA-C working with Toy Cookey, MD by Elveria Rising, ED Scribe. This patient was seen in room WA01/WA01 and the patient's care was started at 10:46 PM.   Chief Complaint  Patient presents with  . Chest Pain   The history is provided by the patient. No language interpreter was used.    HPI Comments: Latoya Hayden is a 39 y.o. female who presents to the Emergency Department complaining of waxing waning chest pain for 3-4 days now. Patient evaluated by PCP for her stress and chest pain. EKG in office was normal. MD provided prescription for Lorazepam as needed. Patient states that she has not taken a full tablet; her last dose was yesterday, 1/4 of pill. Patient reports that her chest pain worsened this morning around 3-4am. Patient characterizes pain as "clenching muscles" in her left lateral chest with breathing and intermittent tingling in her left bicipital arm. Patient reports alleviation of her chest pain this morning with "opening up her rib cage" by placing pillows beneath her back. Patient reports gradual improvement since she has arrived to ED and attributes symptoms to panic/anxeity attack. Patient reports mounting stress precipitated by a big upcoming move to [place] state. Patient shares history of similar symptoms with previous move 4 years ago. Patient denies shortness of breath, headache, fever, abdominal pain, palpitations,  Patient denies injury.   Past Medical History  Diagnosis Date  . Diabetes mellitus without complication   . GERD (gastroesophageal reflux disease)   . Hyperlipidemia    Past Surgical History  Procedure Laterality Date  . Appendectomy    . Anterior cruciate ligament repair Right 12/12/2004  . Cholecystectomy N/A 08/11/2012    Procedure: LAPAROSCOPIC CHOLECYSTECTOMY WITH INTRAOPERATIVE CHOLANGIOGRAM;  Surgeon: Lodema Pilot, DO;  Location: [facility] OR;   Service: General;  Laterality: N/A;   No family history on file. History  Substance Use Topics  . Smoking status: Former Smoker    Quit date: 08/07/2003  . Smokeless tobacco: Not on file  . Alcohol Use: Yes   OB History    No data available     Review of Systems  Constitutional: Negative for fever.  Respiratory: Negative for shortness of breath.   Cardiovascular: Positive for chest pain (pleuritc chest pain ). Negative for palpitations.  Gastrointestinal: Negative for abdominal pain.  Neurological: Negative for weakness.       Tingling in left lateral aspect of upper arm.     Allergies  Review of patient's allergies indicates no known allergies.  Home Medications   Prior to Admission medications   Medication Sig Start Date End Date Taking? Authorizing Provider  fenofibrate 54 MG tablet Take 54 mg by mouth daily.  07/09/12   Historical Provider, MD  metFORMIN (GLUCOPHAGE) 500 MG tablet Take 500 mg by mouth 2 (two) times daily with a meal.  08/05/12   Historical Provider, MD  Probiotic Product (PROBIOTIC PO) Take 1 capsule by mouth daily.    Historical Provider, MD  TRI-PREVIFEM 0.18/0.215/0.25 MG-35 MCG tablet Take 1 tablet by mouth daily.  07/09/12   Historical Provider, MD   Triage Vitals: BP 154/85 mmHg  Pulse 99  Temp(Src) 98.4 F (36.9 C) (Oral)  Resp 20  SpO2 100%  LMP 10/25/2014 Physical Exam  Constitutional: She is oriented to person, place, and time. She appears well-developed and well-nourished. No distress.  HENT:  Head: Normocephalic and atraumatic.  Eyes: EOM are normal.  Neck: Neck supple. No tracheal deviation present.  Cardiovascular: Normal rate and intact distal pulses.  Exam reveals no gallop and no friction rub.   No murmur heard. Pulmonary/Chest: Effort normal. No respiratory distress. She exhibits no tenderness (no tenderness on palpation underneath left breast. She feels relief with palpation).    Abdominal: She exhibits no mass. There is no  tenderness. There is no rebound.  Musculoskeletal: Normal range of motion.  Good pulses. Normal ROM of left shoulder in flexion, abduction, external and internal rotation.   Neurological: She is alert and oriented to person, place, and time.  Light sensation intact bilaterally to upper and lower extremities.   Skin: Skin is warm and dry.  Psychiatric: She has a normal mood and affect. Her behavior is normal.  Nursing note and vitals reviewed.   ED Course  Procedures (including critical care time)  COORDINATION OF CARE: 11:58 PM- Discussed treatment plan with patient at bedside and patient agreed to plan.   Labs Review Labs Reviewed  BASIC METABOLIC PANEL - Abnormal; Notable for the following:    Glucose, Bld 106 (*)    Creatinine, Ser 1.18 (*)    GFR calc non Af Amer 57 (*)    All other components within normal limits  CBC - Abnormal; Notable for the following:    RBC 5.19 (*)    All other components within normal limits  I-STAT TROPOININ, ED   Results for orders placed or performed during the hospital encounter of 10/29/14  Basic metabolic panel  Result Value Ref Range   Sodium 137 135 - 145 mmol/L   Potassium 4.0 3.5 - 5.1 mmol/L   Chloride 104 101 - 111 mmol/L   CO2 25 22 - 32 mmol/L   Glucose, Bld 106 (H) 65 - 99 mg/dL   BUN 18 6 - 20 mg/dL   Creatinine, Ser 1.61 (H) 0.44 - 1.00 mg/dL   Calcium 9.2 8.9 - 09.6 mg/dL   GFR calc non Af Amer 57 (L) >60 mL/min   GFR calc Af Amer >60 >60 mL/min   Anion gap 8 5 - 15  CBC  Result Value Ref Range   WBC 8.9 4.0 - 10.5 K/uL   RBC 5.19 (H) 3.87 - 5.11 MIL/uL   Hemoglobin 14.4 12.0 - 15.0 g/dL   HCT 04.5 40.9 - 81.1 %   MCV 85.9 78.0 - 100.0 fL   MCH 27.7 26.0 - 34.0 pg   MCHC 32.3 30.0 - 36.0 g/dL   RDW 91.4 78.2 - 95.6 %   Platelets 366 150 - 400 K/uL  I-stat troponin, ED  Result Value Ref Range   Troponin i, poc 0.00 0.00 - 0.08 ng/mL   Comment 3            Imaging Review Dg Chest 2 View  10/29/2014   CLINICAL  DATA:  Four day history of left-sided chest pain  EXAM: CHEST  2 VIEW  COMPARISON:  Chest CT January 06, 2011  FINDINGS: Lungs are clear. Heart size and pulmonary vascularity are normal. No adenopathy. No pneumothorax. No bone lesions.  IMPRESSION: No abnormality noted.   Electronically Signed   By: Bretta Bang III M.D.   On: 10/29/2014 21:16     EKG Interpretation None      MDM   Final diagnoses:  None    1. Anxiety  She is feeling better over time without intervention. EKG without acute ischemic changes. Labs reassuring. VSS, no hypoxia.  Patient is PERC negative - no concern for PE. Feel symptoms are related to anxious state, resolved on final re-evaluation. She is felt stable for discharge home.   I personally performed the services described in this documentation, which was scribed in my presence. The recorded information has been reviewed and is accurate.     Elpidio Anis, PA-C 11/03/14 2004  Toy Cookey, MD 11/04/14 0010

## 2014-10-29 NOTE — ED Notes (Signed)
Pt from home c/o left sided sharp chest pain and left arm pain x 4 days. She reports being under a lot of stress related to [place]. Denies nausea, vomiting, shortness of breath.

## 2014-10-29 NOTE — ED Notes (Signed)
PA at bedside.

## 2015-02-05 IMAGING — US US ABDOMEN COMPLETE
1 series · 13 of 25 positions shown · non-contrast
Comparison: None.

CLINICAL DATA: Epigastric abdominal pain

COMPLETE ABDOMINAL ULTRASOUND

[Series 1: us abdomen complete · 0.33mm/px · 13 of 59 slices shown]
[im 1/59]
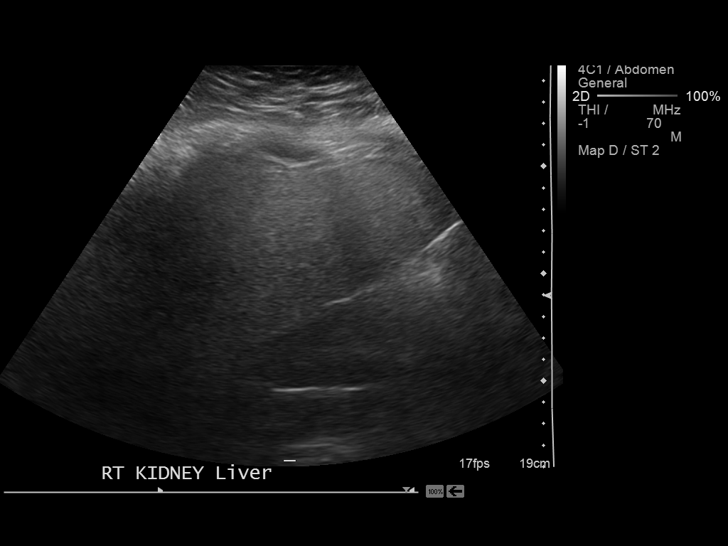
[im 5/59]
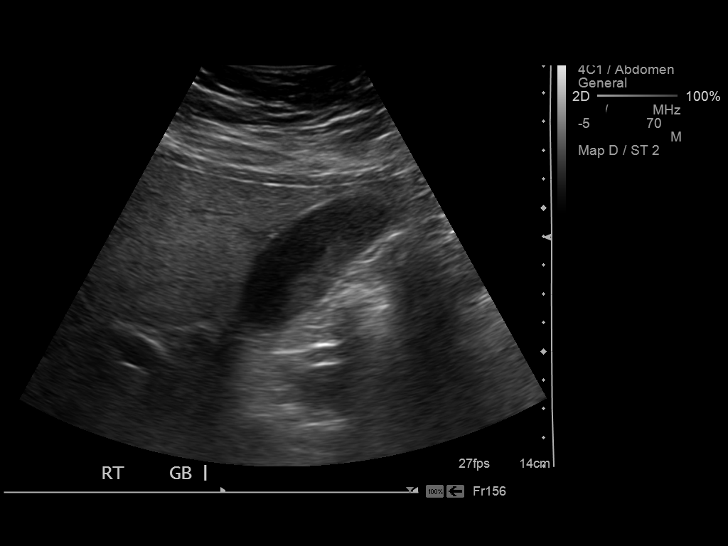
[im 10/59]
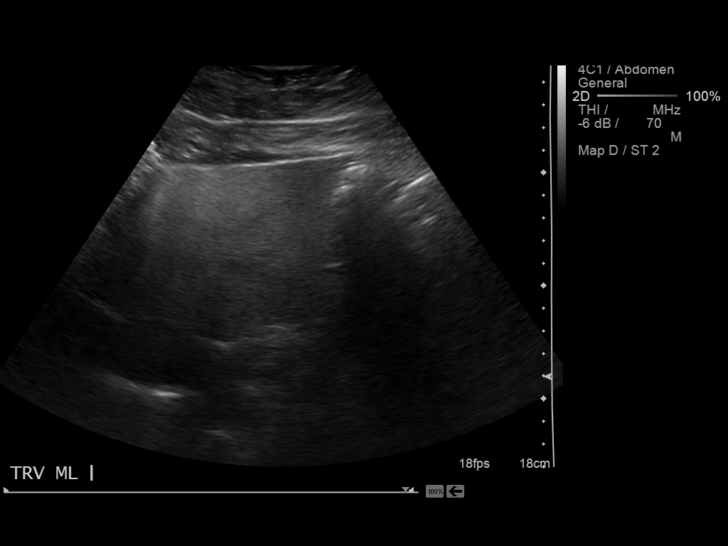
[im 15/59]
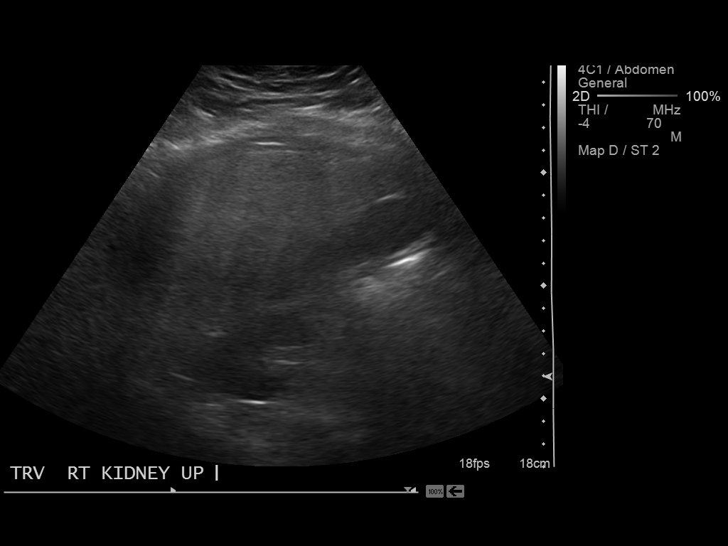
[im 20/59]
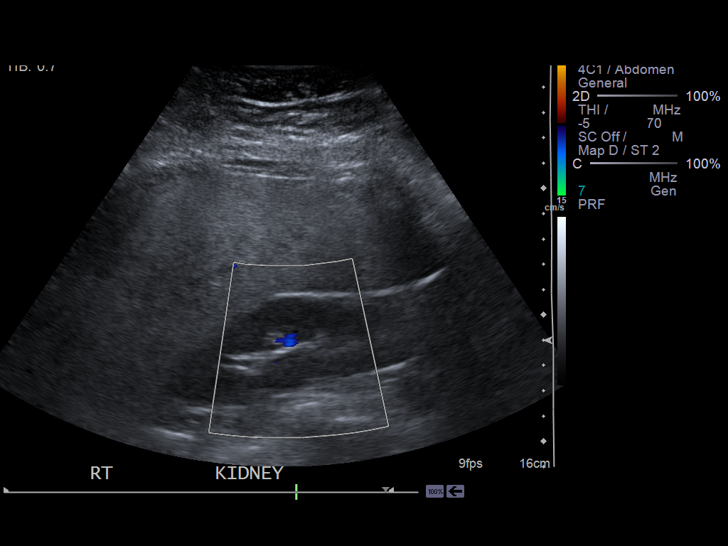
[im 25/59]
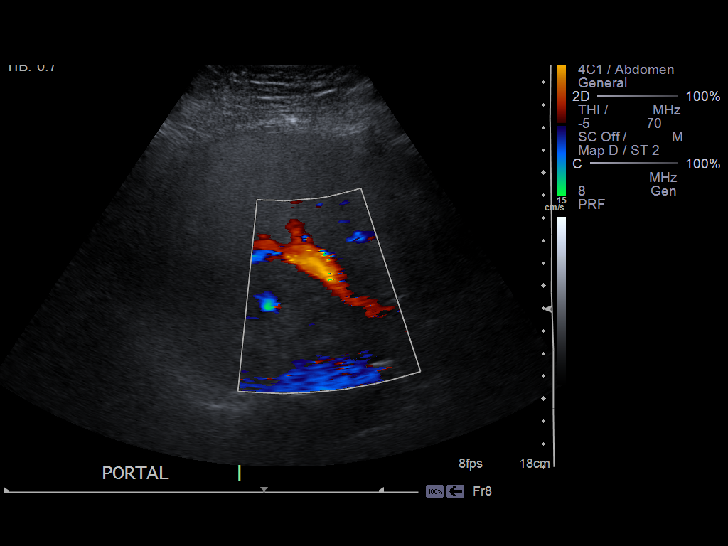
[im 30/59]
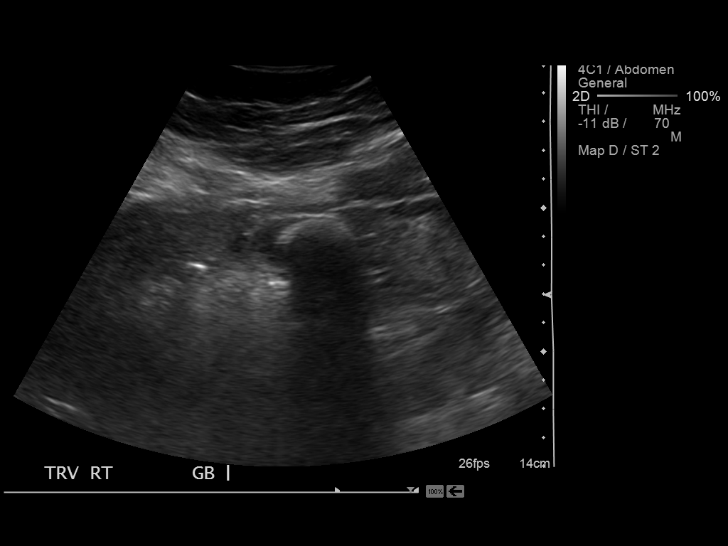
[im 34/59]
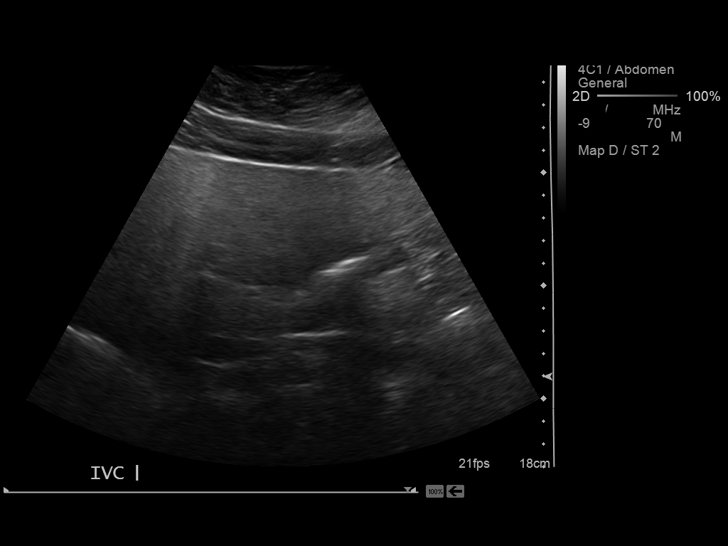
[im 39/59]
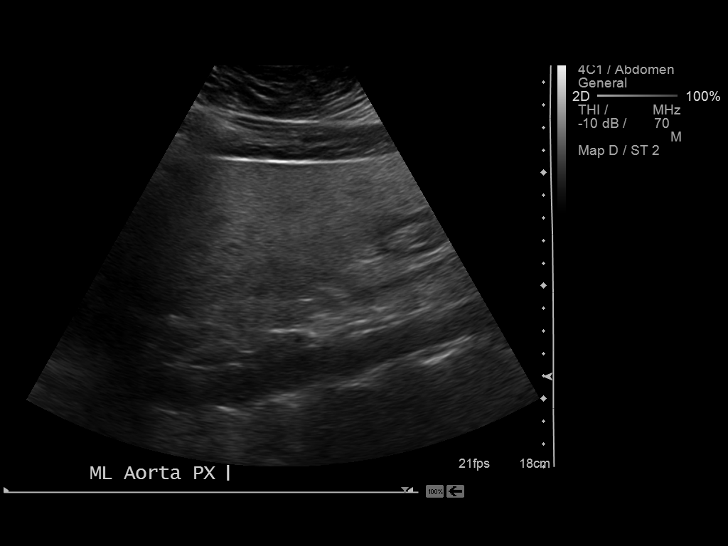
[im 44/59]
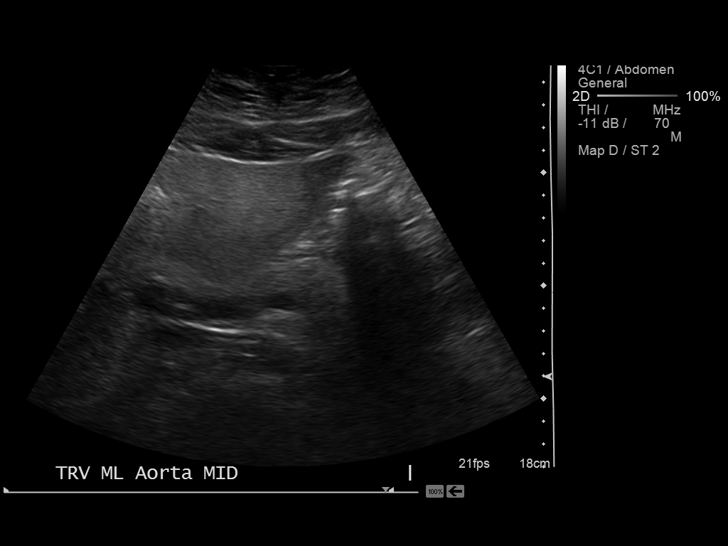
[im 49/59]
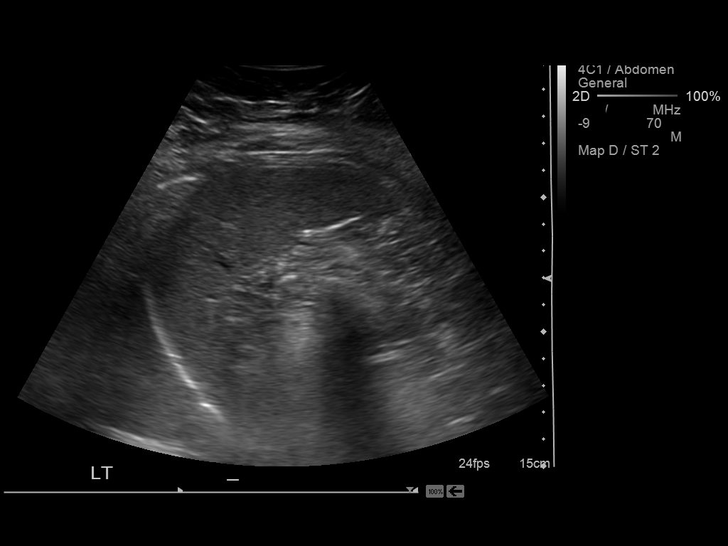
[im 54/59]
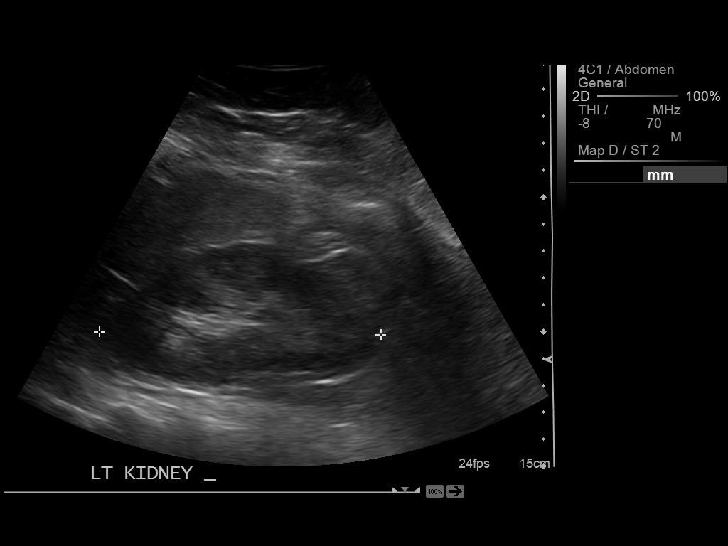
[im 59/59]
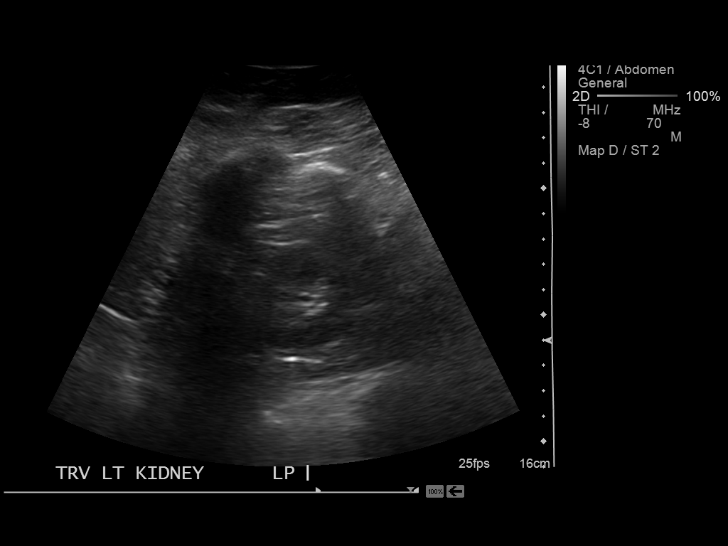

[13 of 25 positions shown; findings below may reference images not displayed]

FINDINGS: Gallbladder:  A large solitary gallstone is identified within the
gallbladder fundus measuring 2.6 cm in length.  Some associated
gallbladder sludge is noted.  No pericholecystic fluid or
gallbladder wall thickening is seen and evaluation for a
sonographic Murphy's sign is negative

Common bile duct:  Appears slightly enlarged measuring 6.8 mm in
diameter.  No signs of choledocholelithiasis are identified
sonographically

Liver:  Demonstrates a diffuse increase in echotexture compatible
with underlying hepatic steatosis or hepatocellular dysfunction.
No focal parenchymal abnormality or signs of intrahepatic ductal
dilatation are seen.  The hepatic capsule appears smooth

IVC:  The proximal portion appears normal

Pancreas:  The head and body are normal in size and echotexture.
The tail is obscured by overlying gas and incompletely assessed

Spleen:  Has a sagittal length of 8.9 cm and homogeneous
parenchymal pattern

Right Kidney:  Demonstrates a sagittal length of 10.6 cm.  No focal
parenchymal abnormalities or signs of hydronephrosis are seen

Left Kidney:  Demonstrates a sagittal length of 10.5 cm.  No focal
parenchymal abnormality or signs of hydronephrosis are seen

Abdominal aorta:  The distal bifurcation is obscured by overlying
gas.  The more proximal aorta has a maximal caliber of 2.3 cm.
Mild irregularity suggests mild atheromatous change.
IMPRESSION: Findings compatible with hepatic steatosis or hepatocellular
dysfunction.

Cholelithiasis and gallbladder sludge without evidence for
associated cholecystitis.

Prominent common bile duct with no intraluminal abnormality
identified.

Question mild aortic atheromatous change.

## 2015-02-16 IMAGING — RF DG CHOLANGIOGRAM OPERATIVE
1 series · 10 of 10 positions shown · non-contrast
Comparison: 07/31/2012

CLINICAL DATA: Cholelithiasis, cholecystectomy

INTRAOPERATIVE CHOLANGIOGRAM
TECHNIQUE: Multiple fluoroscopic spot radiographs were obtained
during intraoperative cholangiogram and are submitted for
interpretation post-operatively.
Fluoroscopy Time: 20 seconds

[Series 1: run · 4 acquisitions, 10 frames shown]
[im 1/4]
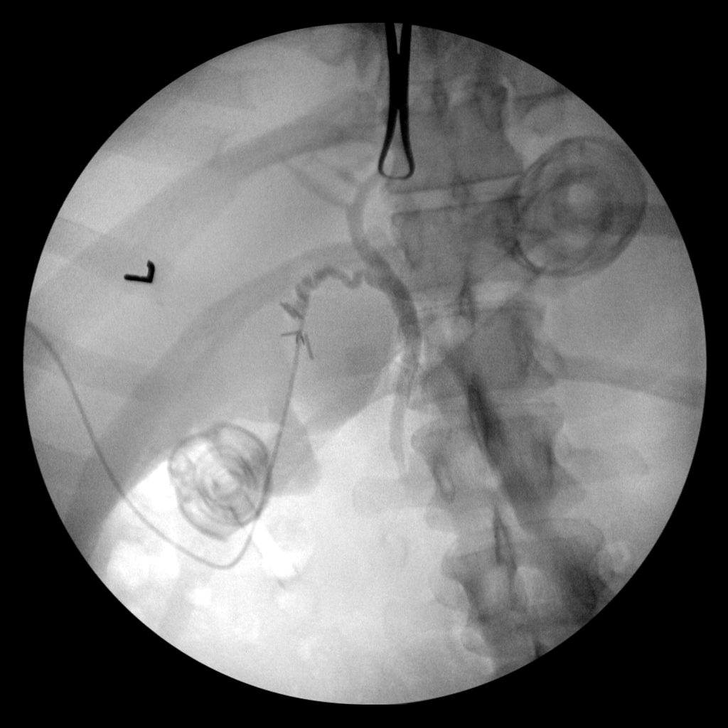
[im 1/4]
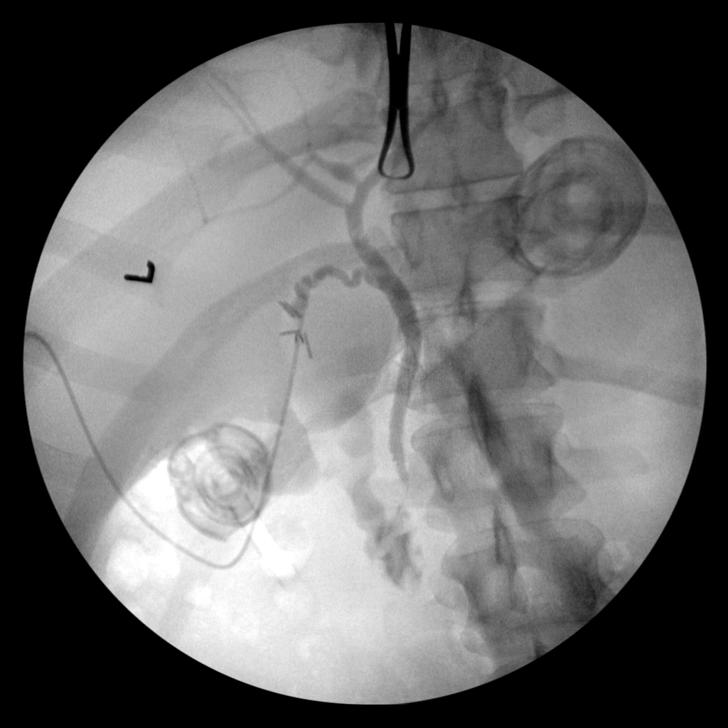
[im 1/4]
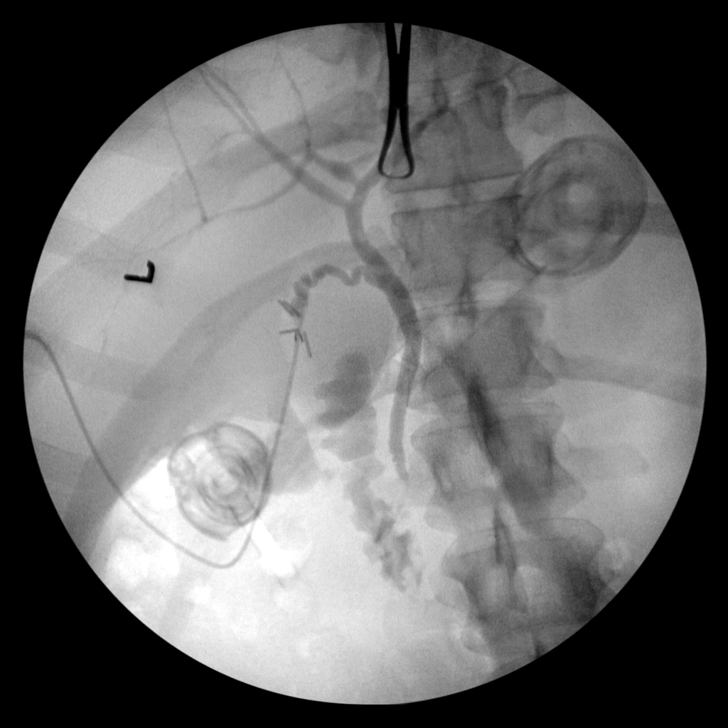
[im 1/4]
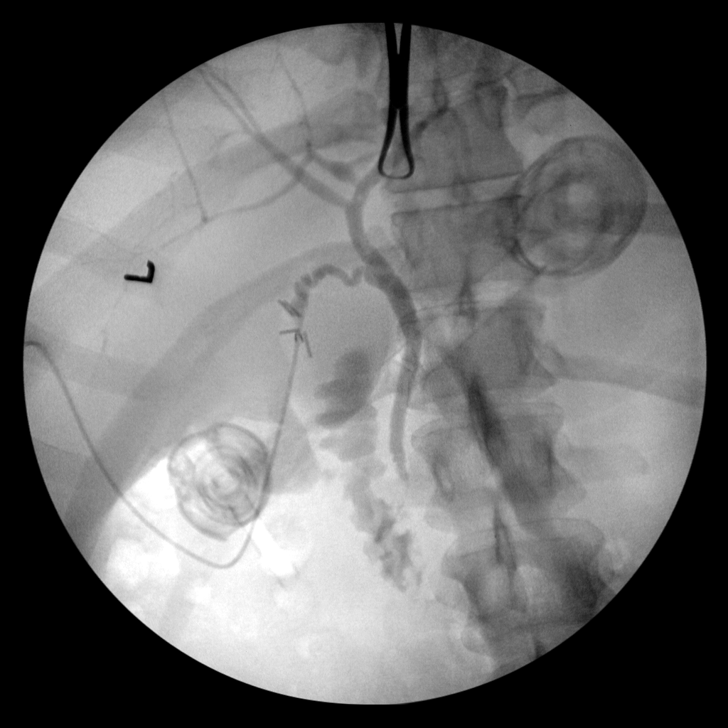
[im 2/4]
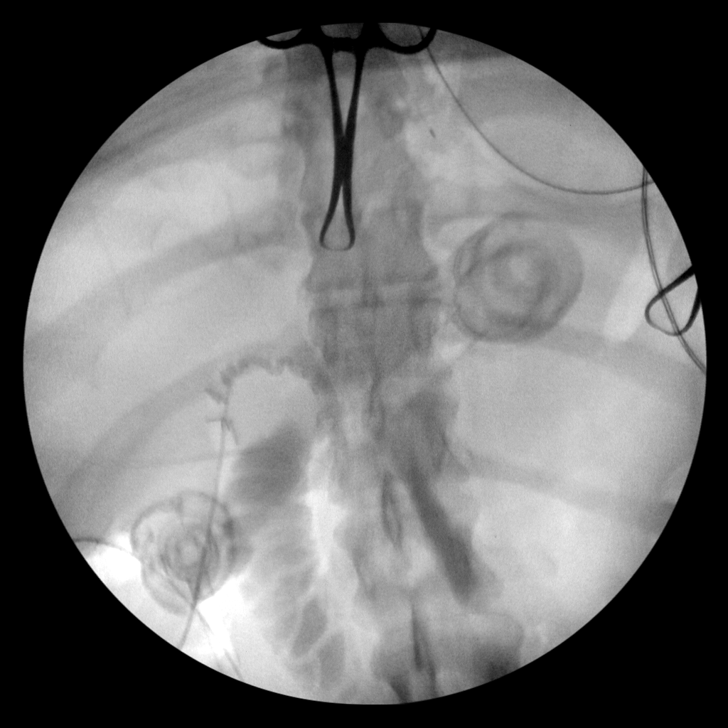
[im 2/4]
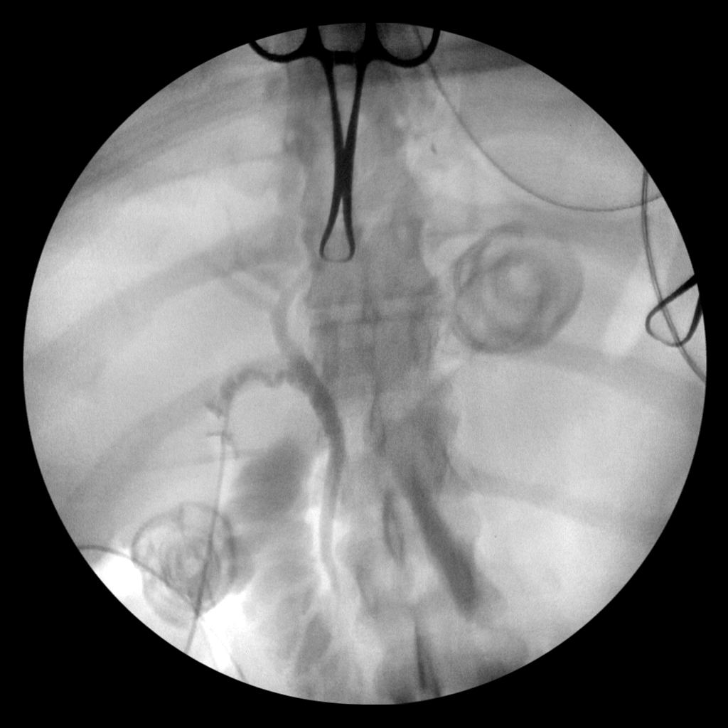
[im 2/4]
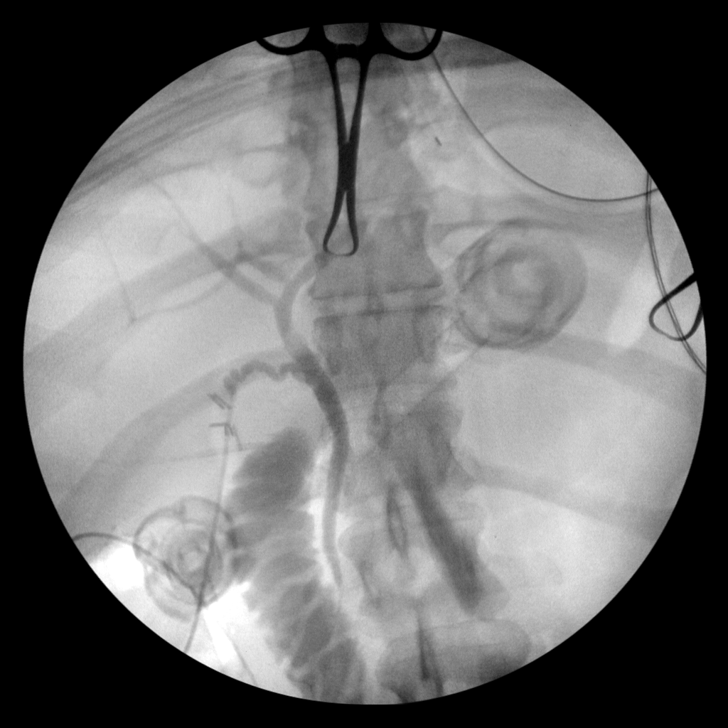
[im 2/4]
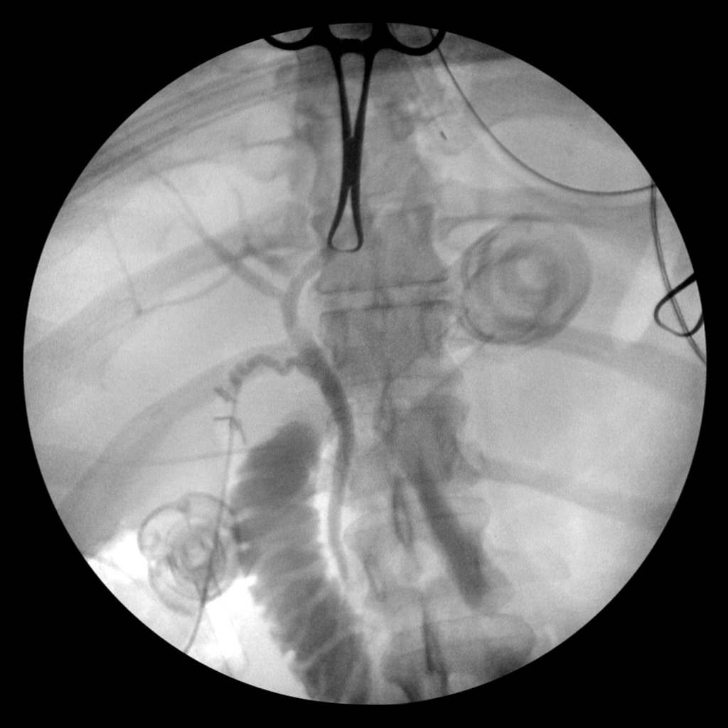
[im 3/4]
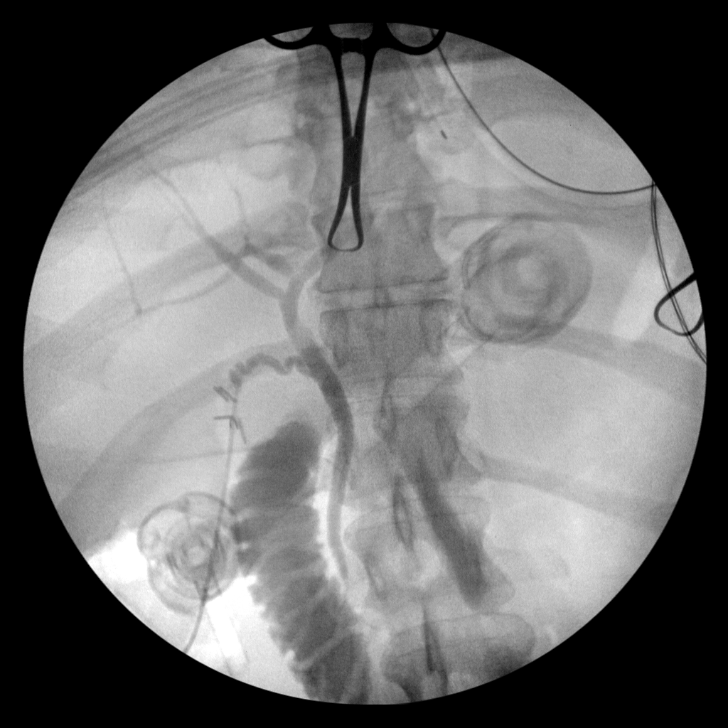
[im 4/4]
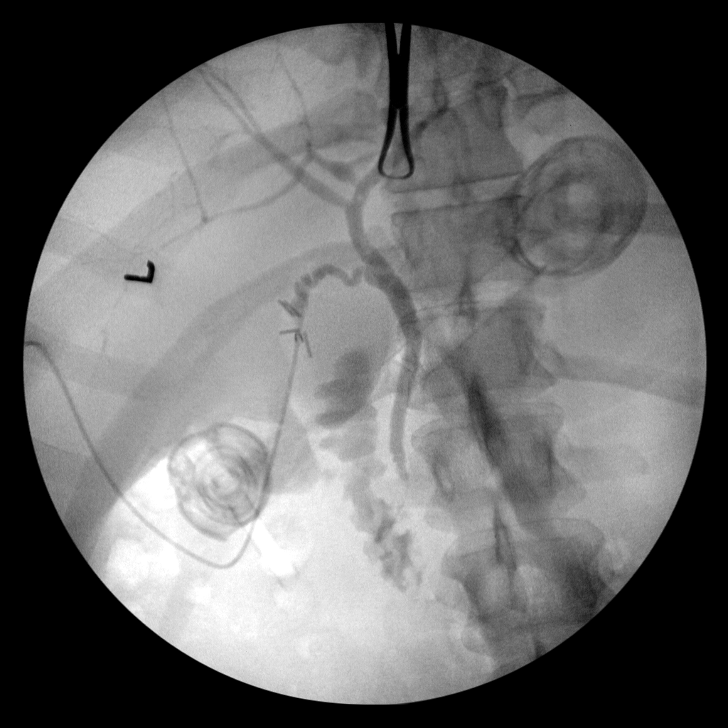

[10 of 10 positions shown; findings below may reference images not displayed]

FINDINGS: Patent biliary system.  The biliary confluence, common
hepatic duct, cystic duct and common bile duct are all patent.
Small round filling defects at the biliary confluence and common
hepatic duct are mobile and most consistent with injected air
bubbles.  No obstruction.
IMPRESSION: Patent biliary system.

Probable injected air bubbles within the biliary confluence and
common hepatic duct.

## 2017-05-05 IMAGING — CR DG CHEST 2V
2 series · 2 of 2 positions shown · non-contrast
Comparison: Chest CT January 06, 2011

CLINICAL DATA: Four day history of left-sided chest pain

EXAM:
CHEST  2 VIEW

[w chest pa]
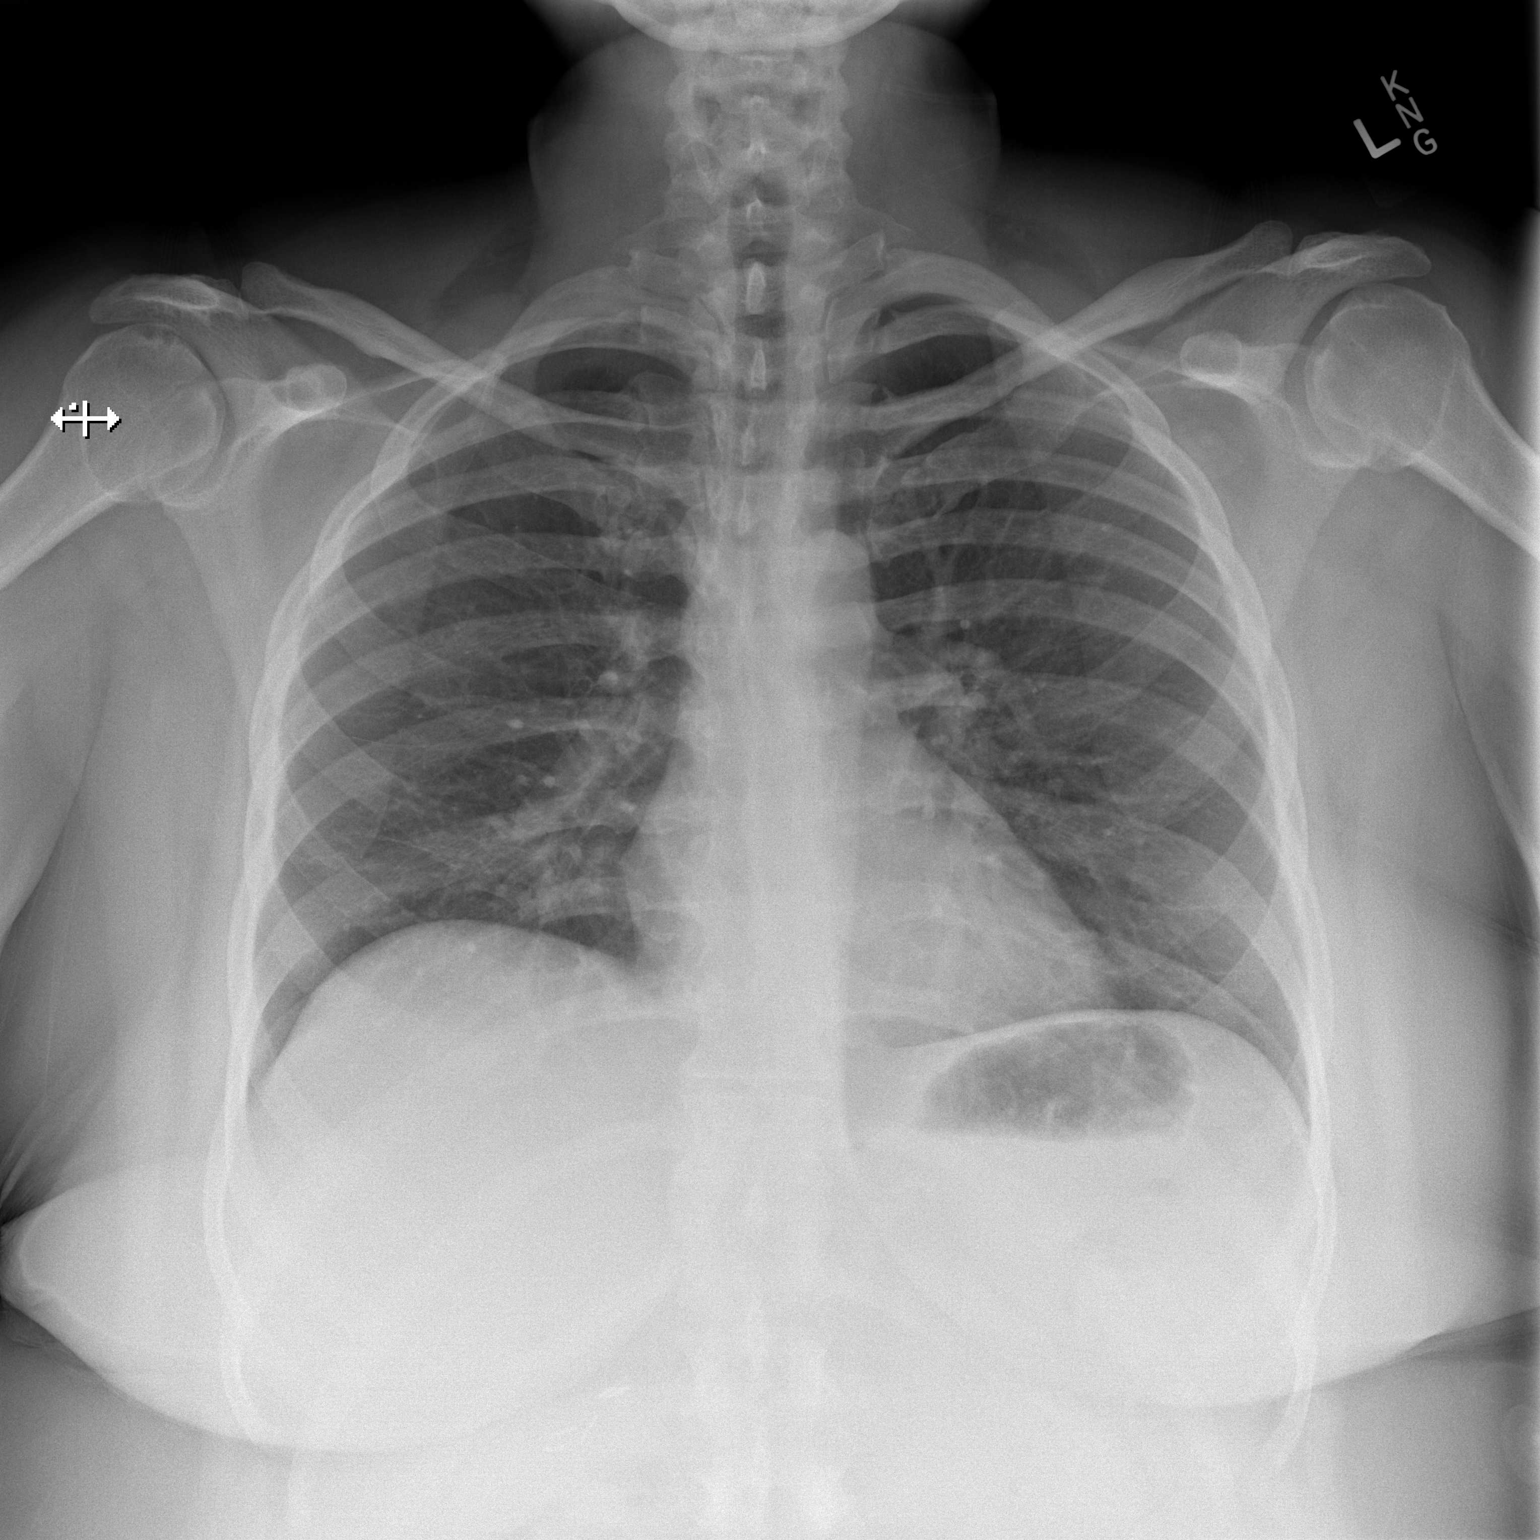

[w chest lat]
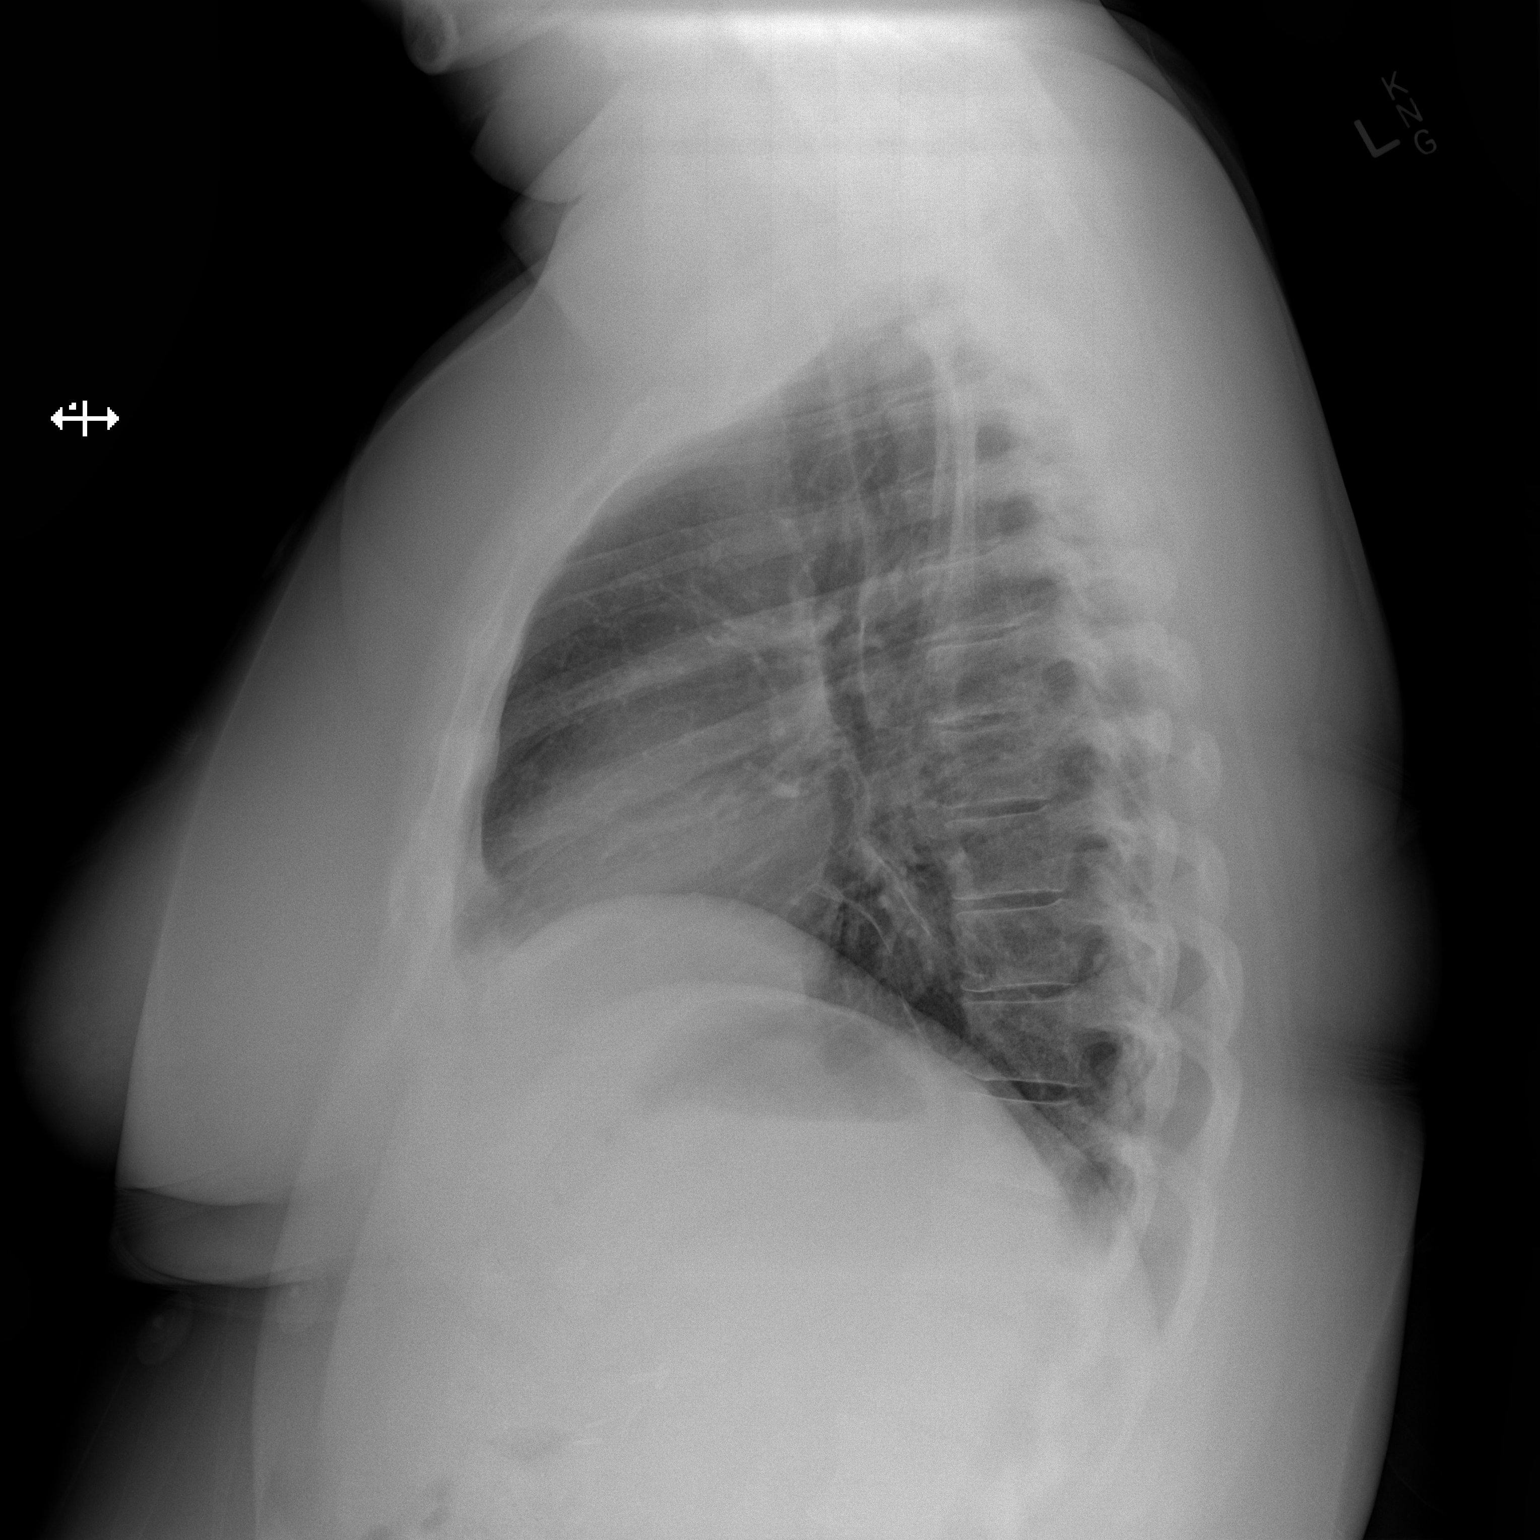

[2 of 2 positions shown; findings below may reference images not displayed]

FINDINGS: Lungs are clear. Heart size and pulmonary vascularity are normal. No
adenopathy. No pneumothorax. No bone lesions.
IMPRESSION: No abnormality noted.
